# Patient Record
Sex: Female | Born: 1980 | Race: Black or African American | Hispanic: No | Marital: Single | Smoking: Never smoker
Health system: Southern US, Community
[De-identification: ages and names within clinical notes are randomized; demographics above are authoritative.]

## PROBLEM LIST (undated history)

## (undated) DIAGNOSIS — Z862 Personal history of diseases of the blood and blood-forming organs and certain disorders involving the immune mechanism: Secondary | ICD-10-CM

## (undated) DIAGNOSIS — F32A Depression, unspecified: Secondary | ICD-10-CM

## (undated) DIAGNOSIS — Z8742 Personal history of other diseases of the female genital tract: Secondary | ICD-10-CM

## (undated) DIAGNOSIS — Z87440 Personal history of urinary (tract) infections: Secondary | ICD-10-CM

## (undated) DIAGNOSIS — I10 Essential (primary) hypertension: Secondary | ICD-10-CM

## (undated) DIAGNOSIS — G43909 Migraine, unspecified, not intractable, without status migrainosus: Secondary | ICD-10-CM

## (undated) HISTORY — DX: Personal history of other diseases of the female genital tract: Z87.42

## (undated) HISTORY — DX: Personal history of diseases of the blood and blood-forming organs and certain disorders involving the immune mechanism: Z86.2

## (undated) HISTORY — DX: Migraine, unspecified, not intractable, without status migrainosus: G43.909

## (undated) HISTORY — DX: Depression, unspecified: F32.A

## (undated) HISTORY — DX: Personal history of urinary (tract) infections: Z87.440

---

## 2017-05-26 ENCOUNTER — Emergency Department: Payer: Self-pay

## 2017-05-26 ENCOUNTER — Emergency Department
Admission: EM | Admit: 2017-05-26 | Discharge: 2017-05-26 | Disposition: A | Discharge status: Home / Self Care | Payer: Self-pay | Attending: Emergency Medicine | Admitting: Emergency Medicine

## 2017-05-26 ENCOUNTER — Encounter: Payer: Self-pay | Admitting: Emergency Medicine

## 2017-05-26 DIAGNOSIS — I10 Essential (primary) hypertension: Secondary | ICD-10-CM | POA: Insufficient documentation

## 2017-05-26 DIAGNOSIS — R079 Chest pain, unspecified: Secondary | ICD-10-CM | POA: Insufficient documentation

## 2017-05-26 HISTORY — DX: Essential (primary) hypertension: I10

## 2017-05-26 LAB — BASIC METABOLIC PANEL
Anion gap: 5 (ref 5–15)
BUN: 15 mg/dL (ref 6–20)
CALCIUM: 9.3 mg/dL (ref 8.9–10.3)
CHLORIDE: 103 mmol/L (ref 101–111)
CO2: 28 mmol/L (ref 22–32)
CREATININE: 0.88 mg/dL (ref 0.44–1.00)
GFR calc non Af Amer: >60 mL/min (ref >60)
Glucose, Bld: 79 mg/dL (ref 65–99)
Potassium: 3.7 mmol/L (ref 3.5–5.1)
Sodium: 136 mmol/L (ref 135–145)

## 2017-05-26 LAB — CBC
HCT: 39 % (ref 35.0–47.0)
Hemoglobin: 13 g/dL (ref 12.0–16.0)
MCH: 31.9 pg (ref 26.0–34.0)
MCHC: 33.3 g/dL (ref 32.0–36.0)
MCV: 95.6 fL (ref 80.0–100.0)
PLATELETS: 302 10*3/uL (ref 150–440)
RBC: 4.08 MIL/uL (ref 3.80–5.20)
RDW: 13.2 % (ref 11.5–14.5)
WBC: 5.3 10*3/uL (ref 3.6–11.0)

## 2017-05-26 LAB — TROPONIN I

## 2017-05-26 MED ORDER — IBUPROFEN 800 MG PO TABS: 800.0000 mg | ORAL_TABLET | Freq: Three times a day (TID) | ORAL | 0 refills | Status: DC | PRN

## 2017-05-26 MED ORDER — KETOROLAC TROMETHAMINE 60 MG/2ML IM SOLN
60.0000 mg | Freq: Once | INTRAMUSCULAR | Status: AC
  Administered 2017-05-26: 60 mg via INTRAMUSCULAR
  Filled 2017-05-26: qty 2

## 2017-05-26 NOTE — ED Triage Notes (Signed)
Pt reports intermittent chest pain over the last 2 weeks; sharp pain to the center of her chest that is non-radiating; denies shortness of breath; reports "hot flashes" a week ago and vomiting once yesterday; pt says over the last few days pain has been more constant; also c/o headache; pt's blood pressure 159/99 in triage; pt has been out of her medication for HTN for about 1 month; denies dizziness/lightheadedness; alert and oriented x 3; talking in complete coherent sentences

## 2017-05-26 NOTE — ED Provider Notes (Signed)
[facility] Emergency Department Provider Note  ____________________________________________  Time seen: Approximately 8:41 PM  I have reviewed the triage vital signs and the nursing notes.   HISTORY  Chief Complaint Chest Pain    HPI Makayla Booker is a 36 y.o. female with a history of hypertension presenting with chest pain. The patient reports that for the past 2 weeks she has had a central sharp chest pain. Initially, it was intermittent, but now it is constant. It is worse if she presses on it, If she stands for long time, or if she lays down. If she walks around she does not feel it. She does not have any associated symptoms including radiation of pain, diaphoresis, nausea or vomiting, palpitations, lightheadedness or syncope. The pain is not worse with food. She occasionally has unrelated shortness of breath, which is chronic for her. She also has chronic migraines; when she takes Tylenol for her migraines, this does not help her chest pain. She is not tried anything else for her chest pain. Her last stress test was "years ago." She reports that she has been evaluated for atypical chest pain in the past and we'll that it was related to stress.  SH: Nonsmoker, denies cocaine  FH: No history of early CAD or blood clots Past Medical History:  Diagnosis Date  . Hypertension     There are no active problems to display for this patient.   History reviewed. No pertinent surgical history.    Allergies Penicillins  History reviewed. No pertinent family history.  Social History Social History  Substance Use Topics  . Smoking status: Never Smoker  . Smokeless tobacco: Never Used  . Alcohol use Yes    Review of Systems Constitutional: No fever/chills. Eyes: No visual changes. ENT: No sore throat. No congestion or rhinorrhea. Cardiovascular: + chest pain. Denies palpitations. Respiratory: Positive unchanged chronic shortness of breath.  No  cough. Gastrointestinal: No abdominal pain.  No nausea, no vomiting.  No diarrhea.  No constipation. Genitourinary: Negative for dysuria. Musculoskeletal: Negative for back pain. Skin: Negative for rash. Neurological: Positive unchanged chronic migraines. No focal numbness, tingling or weakness.     ____________________________________________   PHYSICAL EXAM:  VITAL SIGNS: ED Triage Vitals  Enc Vitals Group     BP 05/26/17 1945 (!) 159/99     Pulse Rate 05/26/17 1945 86     Resp 05/26/17 1945 18     Temp 05/26/17 1945 97.7 F (36.5 C)     Temp Source 05/26/17 1945 Oral     SpO2 05/26/17 1945 97 %     Weight 05/26/17 1945 252 lb (114.3 kg)     Height 05/26/17 1945 5\' 8"  (1.727 m)     Head Circumference --      Peak Flow --      Pain Score 05/26/17 1944 10     Pain Loc --      Pain Edu? --      Excl. in GC? --     Constitutional: Alert and oriented. Well appearing and in no acute distress. Answers questions appropriately. Eyes: Conjunctivae are normal.  EOMI. No scleral icterus. Head: Atraumatic. Nose: No congestion/rhinnorhea. Mouth/Throat: Mucous membranes are moist.  Neck: No stridor.  Supple.  No JVD. No meningismus Cardiovascular: Normal rate, regular rhythm. No murmurs, rubs or gallops. The patient's pain is reproducible when pressing on the center of her chest. She has no skin changes including ecchymosis, swelling or crepitus. Respiratory: Normal respiratory effort.  No  accessory muscle use or retractions. Lungs CTAB.  No wheezes, rales or ronchi. Gastrointestinal: Soft, nontender and nondistended.  No guarding or rebound.  No peritoneal signs. Musculoskeletal: No LE edema. No ttp in the calves or palpable cords.  Negative Homan's sign. Neurologic:  A&Ox3.  Speech is clear.  Face and smile are symmetric.  EOMI.  Moves all extremities well. Skin:  Skin is warm, dry and intact. No rash noted. Psychiatric: Mood and affect are normal. Speech and behavior are normal.   Normal judgement.  ____________________________________________   LABS (all labs ordered are listed, but only abnormal results are displayed)  Labs Reviewed  BASIC METABOLIC PANEL  CBC  TROPONIN I   ____________________________________________  EKG  ED ECG REPORT I, Rockne MenghiniNorman, Anne-Caroline, the attending physician, personally viewed and interpreted this ECG.   Date: 05/26/2017  EKG Time: 1936  Rate: 80  Rhythm: normal sinus rhythm  Axis: normal  Intervals:none  ST&T Change: No STEMI  ____________________________________________  RADIOLOGY  No results found.  ____________________________________________   PROCEDURES  Procedure(s) performed: None  Procedures  Critical Care performed: No ____________________________________________   INITIAL IMPRESSION / ASSESSMENT AND PLAN / ED COURSE  Pertinent labs & imaging results that were available during my care of the patient were reviewed by me and considered in my medical decision making (see chart for details).  36 y.o. female with a history of hypertension presenting with chest pain for the past 2 weeks that is reproducible on palpation. Overall, the patient is mildly hypertensive here but hemodynamically stable. She has a normal cardiopulmonary examination. Her EKG does not show ischemic changes and her troponin is negative. She has not been having any cough or cold symptoms that would be suggestive of an infectious cause for her symptoms. Her symptoms are not consistent with esophageal spasm or GERD, PUD. The patient has palpable pain, so musculoskeletal causes are possible but the patient has not had any straining or any significant coughing so I cannot explain why she would have this. PE or aortic pathology is very unlikely. At this time, the patient is stable for discharge. I have encouraged her to take Motrin for her pain, and follow up with her primary care physician. I have also asked her to make an appointment with  the cardiologist for risk stratification study since she has not had one for several years. Return precautions were discussed.  I have reviewed the patient's medical chart. ____________________________________________  FINAL CLINICAL IMPRESSION(S) / ED DIAGNOSES  Final diagnoses:  Chest pain, unspecified type         NEW MEDICATIONS STARTED DURING THIS VISIT:  New Prescriptions   IBUPROFEN (ADVIL,MOTRIN) 800 MG TABLET    Take 1 tablet (800 mg total) by mouth every 8 (eight) hours as needed.      Rockne MenghiniNorman, Anne-Caroline, MD 05/26/17 [phone]

## 2017-05-26 NOTE — ED Notes (Signed)
Blood drawn by ED tech Lorin PicketScott

## 2017-05-26 NOTE — ED Notes (Signed)
Pt back to triage from xray; will draw blood and take pt to treatment room in CPOD; verbal report already given to Dr Sharma CovertNorman

## 2017-05-26 NOTE — Discharge Instructions (Signed)
Please take Motrin, with food, three times daily for the next three days, then as needed.  You may additionally take Tylenol for your pain.  Please return to the emergency department if you develop severe pain, shortness of breath, palpitations, fever, or any other symptoms concerning to you

## 2018-03-13 ENCOUNTER — Encounter: Payer: Self-pay | Admitting: Emergency Medicine

## 2018-03-13 ENCOUNTER — Emergency Department
Admission: EM | Admit: 2018-03-13 | Discharge: 2018-03-13 | Disposition: A | Discharge status: Home / Self Care | Payer: Medicaid Other | Attending: Emergency Medicine | Admitting: Emergency Medicine

## 2018-03-13 ENCOUNTER — Emergency Department: Payer: Medicaid Other

## 2018-03-13 DIAGNOSIS — I10 Essential (primary) hypertension: Secondary | ICD-10-CM | POA: Insufficient documentation

## 2018-03-13 DIAGNOSIS — F419 Anxiety disorder, unspecified: Secondary | ICD-10-CM

## 2018-03-13 DIAGNOSIS — R079 Chest pain, unspecified: Secondary | ICD-10-CM

## 2018-03-13 LAB — CBC
HCT: 41.6 % (ref 35.0–47.0)
Hemoglobin: 14 g/dL (ref 12.0–16.0)
MCH: 32.6 pg (ref 26.0–34.0)
MCHC: 33.7 g/dL (ref 32.0–36.0)
MCV: 96.7 fL (ref 80.0–100.0)
PLATELETS: 335 10*3/uL (ref 150–440)
RBC: 4.3 MIL/uL (ref 3.80–5.20)
RDW: 13.2 % (ref 11.5–14.5)
WBC: 7.6 10*3/uL (ref 3.6–11.0)

## 2018-03-13 LAB — TROPONIN I: Troponin I: <0.03 ng/mL (ref <0.03)

## 2018-03-13 LAB — BASIC METABOLIC PANEL
Anion gap: 8 (ref 5–15)
BUN: 15 mg/dL (ref 6–20)
CHLORIDE: 104 mmol/L (ref 98–111)
CO2: 25 mmol/L (ref 22–32)
CREATININE: 1.05 mg/dL — AB (ref 0.44–1.00)
Calcium: 9.7 mg/dL (ref 8.9–10.3)
GFR calc Af Amer: >60 mL/min (ref >60)
GFR calc non Af Amer: >60 mL/min (ref >60)
GLUCOSE: 97 mg/dL (ref 70–99)
Potassium: 4 mmol/L (ref 3.5–5.1)
Sodium: 137 mmol/L (ref 135–145)

## 2018-03-13 MED ORDER — LORAZEPAM 1 MG PO TABS
1.0000 mg | ORAL_TABLET | Freq: Once | ORAL | Status: AC
  Administered 2018-03-13: 1 mg via ORAL

## 2018-03-13 MED ORDER — LORAZEPAM 1 MG PO TABS
ORAL_TABLET | ORAL | Status: AC
  Administered 2018-03-13: 1 mg via ORAL
  Filled 2018-03-13: qty 1

## 2018-03-13 MED ORDER — LORAZEPAM 1 MG PO TABS: 1.0000 mg | ORAL_TABLET | Freq: Two times a day (BID) | ORAL | 0 refills | Status: AC | PRN

## 2018-03-13 NOTE — ED Triage Notes (Signed)
Pt c/o intermittent central chest pain with occasional SOB x1 week. Pt denies N/V.

## 2018-03-13 NOTE — ED Notes (Signed)
Pt verbalizes d/c undedrstanding and RX . Pt calling [employer] for ride home . PT ambulatory, A&OX4

## 2018-03-13 NOTE — ED Provider Notes (Signed)
[facility] Emergency Department Provider Note       Time seen: ----------------------------------------- 9:18 PM on 03/13/2018 -----------------------------------------   I have reviewed the triage vital signs and the nursing notes.  HISTORY   Chief Complaint Chest Pain    HPI Makayla Booker is a [age] y.o. female with a history of hypertension who presents to the ED for chest pain the center part of her chest with occasional shortness of breath for the last week.  Patient denies any nausea or vomiting or other associated symptoms.  She denies any recent illness or other complaints.  Patient states she is under significant stress and currently has an unstable living environment.  Past Medical History:  Diagnosis Date  . Hypertension     There are no active problems to display for this patient.   History reviewed. No pertinent surgical history.  Allergies Penicillins  Social History Social History   Tobacco Use  . Smoking status: Never Smoker  . Smokeless tobacco: Never Used  Substance Use Topics  . Alcohol use: Yes  . Drug use: No   Review of Systems Constitutional: Negative for fever. Cardiovascular: Positive for chest pain Respiratory: Positive for shortness of breath Gastrointestinal: Negative for abdominal pain, vomiting and diarrhea. Genitourinary: Negative for dysuria. Musculoskeletal: Negative for back pain. Skin: Negative for rash. Neurological: Negative for headaches, focal weakness or numbness. Psychiatric: Positive for anxiety  All systems negative/normal/unremarkable except as stated in the HPI  ____________________________________________   PHYSICAL EXAM:  VITAL SIGNS: ED Triage Vitals [03/13/18 2034]  Enc Vitals Group     BP 137/81     Pulse Rate 98     Resp 17     Temp 98 F (36.7 C)     Temp Source Oral     SpO2 98 %     Weight 273 lb (123.8 kg)     Height 5\' 8"  (1.727 m)     Head Circumference      Peak  Flow      Pain Score      Pain Loc      Pain Edu?      Excl. in GC?    Constitutional: Alert and oriented. Well appearing and in no distress. Eyes: Conjunctivae are normal. Normal extraocular movements. ENT   Head: Normocephalic and atraumatic.   Nose: No congestion/rhinnorhea.   Mouth/Throat: Mucous membranes are moist.   Neck: No stridor. Cardiovascular: Normal rate, regular rhythm. No murmurs, rubs, or gallops. Respiratory: Normal respiratory effort without tachypnea nor retractions. Breath sounds are clear and equal bilaterally. No wheezes/rales/rhonchi. Gastrointestinal: Soft and nontender. Normal bowel sounds Musculoskeletal: Nontender with normal range of motion in extremities. No lower extremity tenderness nor edema. Neurologic:  Normal speech and language. No gross focal neurologic deficits are appreciated.  Skin:  Skin is warm, dry and intact. No rash noted. Psychiatric: Mood and affect are normal. Speech and behavior are normal.  ____________________________________________  EKG: Interpreted by me.  Sinus tachycardia with a rate of 105 bpm, normal PR interval, normal QRS, normal QT  ____________________________________________  ED COURSE:  As part of my medical decision making, I reviewed the following data within the electronic MEDICAL RECORD NUMBER History obtained from family if available, nursing notes, old chart and ekg, as well as notes from prior ED visits. Patient presented for chest pain shortness of breath, we will assess with labs and imaging as indicated at this time.   Procedures ____________________________________________   LABS (pertinent positives/negatives)  Labs Reviewed  BASIC METABOLIC PANEL - Abnormal; Notable for the following components:      Result Value   Creatinine, Ser 1.05 (*)    All other components within normal limits  CBC  TROPONIN I  POC URINE PREG, ED    RADIOLOGY  Chest x-ray is  normal  ____________________________________________  DIFFERENTIAL DIAGNOSIS   Musculoskeletal pain, GERD, anxiety, unstable angina unlikely  FINAL ASSESSMENT AND PLAN  Chest pain   Plan: The patient had presented for chest pain which seems noncardiac. Patient's labs were reassuring. Patient's imaging was also reassuring.  Her symptoms seem to be stress related, we gave her a dose of Ativan here and will prescribe similar for her to take as needed.  She is cleared for outpatient follow-up.   Ulice Dash, MD   Note: This note was generated in part or whole with voice recognition software. Voice recognition is usually quite accurate but there are transcription errors that can and very often do occur. I apologize for any typographical errors that were not detected and corrected.     Emily Filbert, MD 03/13/18 2140

## 2018-08-20 ENCOUNTER — Ambulatory Visit: Payer: Self-pay

## 2018-08-27 ENCOUNTER — Encounter: Payer: Self-pay | Admitting: Gerontology

## 2018-08-27 ENCOUNTER — Ambulatory Visit: Payer: Medicaid Other | Admitting: Gerontology

## 2018-08-27 ENCOUNTER — Other Ambulatory Visit: Payer: Self-pay

## 2018-08-27 VITALS — BP 147/99 | HR 90 | Ht 68.0 in | Wt 281.8 lb

## 2018-08-27 DIAGNOSIS — Z0000 Encounter for general adult medical examination without abnormal findings: Secondary | ICD-10-CM

## 2018-08-27 DIAGNOSIS — Z7689 Persons encountering health services in other specified circumstances: Secondary | ICD-10-CM

## 2018-08-27 DIAGNOSIS — I10 Essential (primary) hypertension: Secondary | ICD-10-CM

## 2018-08-27 MED ORDER — AMLODIPINE BESYLATE 10 MG PO TABS: 10.0000 mg | ORAL_TABLET | Freq: Every day | ORAL | 0 refills | Status: DC

## 2018-08-27 NOTE — Patient Instructions (Signed)
Amlodipine tablets What is this medicine? AMLODIPINE (am LOE di peen) is a calcium-channel blocker. It affects the amount of calcium found in your heart and muscle cells. This relaxes your blood vessels, which can reduce the amount of work the heart has to do. This medicine is used to lower high blood pressure. It is also used to prevent chest pain. This medicine may be used for other purposes; ask your health care provider or pharmacist if you have questions. COMMON BRAND NAME(S): Norvasc What should I tell my health care provider before I take this medicine? They need to know if you have any of these conditions: -heart disease -liver disease -an unusual or allergic reaction to amlodipine, other medicines, foods, dyes, or preservatives -pregnant or trying to get pregnant -breast-feeding How should I use this medicine? Take this medicine by mouth with a glass of water. Follow the directions on the prescription label. You can take it with or without food. If it upsets your stomach, take it with food. Take your medicine at regular intervals. Do not take it more often than directed. Do not stop taking except on your doctor's advice. Talk to your pediatrician regarding the use of this medicine in children. While this drug may be prescribed for children as young as 6 years for selected conditions, precautions do apply. Patients over 65 years of age may have a stronger reaction and need a smaller dose. Overdosage: If you think you have taken too much of this medicine contact a poison control center or emergency room at once. NOTE: This medicine is only for you. Do not share this medicine with others. What if I miss a dose? If you miss a dose, take it as soon as you can. If it is almost time for your next dose, take only that dose. Do not take double or extra doses. What may interact with this medicine? Do not take this medicine with any of the following medications: -tranylcypromine This medicine  may also interact with the following medications: -clarithromycin -cyclosporine -diltiazem -itraconazole -simvastatin -tacrolimus This list may not describe all possible interactions. Give your health care provider a list of all the medicines, herbs, non-prescription drugs, or dietary supplements you use. Also tell them if you smoke, drink alcohol, or use illegal drugs. Some items may interact with your medicine. What should I watch for while using this medicine? Visit your healthcare professional for regular checks on your progress. Check your blood pressure as directed. Ask your healthcare professional what your blood pressure should be and when you should contact him or her. Do not treat yourself for coughs, colds, or pain while you are using this medicine without asking your healthcare professional for advice. Some medicines may increase your blood pressure. You may get dizzy. Do not drive, use machinery, or do anything that needs mental alertness until you know how this medicine affects you. Do not stand or sit up quickly, especially if you are an older patient. This reduces the risk of dizzy or fainting spells. Avoid alcoholic drinks; they can make you dizzier. What side effects may I notice from receiving this medicine? Side effects that you should report to your doctor or health care professional as soon as possible: -allergic reactions like skin rash, itching or hives; swelling of the face, lips, or tongue -fast, irregular heartbeat -signs and symptoms of low blood pressure like dizziness; feeling faint or lightheaded, falls; unusually weak or tired -swelling of ankles, feet, hands Side effects that usually do not require medical   attention (report these to your doctor or health care professional if they continue or are bothersome): -dry mouth -facial flushing -headache -stomach pain -tiredness This list may not describe all possible side effects. Call your doctor for medical advice  about side effects. You may report side effects to FDA at 1-800-FDA-1088. Where should I keep my medicine? Keep out of the reach of children. Store at room temperature between 59 and 86 degrees F (15 and 30 degrees C). Throw away any unused medicine after the expiration date. NOTE: This sheet is a summary. It may not cover all possible information. If you have questions about this medicine, talk to your doctor, pharmacist, or health care provider.  2019 Elsevier/Gold Standard (2018-02-14 15:07:10) DASH Eating Plan DASH stands for "Dietary Approaches to Stop Hypertension." The DASH eating plan is a healthy eating plan that has been shown to reduce high blood pressure (hypertension). It may also reduce your risk for type 2 diabetes, heart disease, and stroke. The DASH eating plan may also help with weight loss. What are tips for following this plan?  General guidelines  Avoid eating more than 2,300 mg (milligrams) of salt (sodium) a day. If you have hypertension, you may need to reduce your sodium intake to 1,500 mg a day.  Limit alcohol intake to no more than 1 drink a day for nonpregnant women and 2 drinks a day for men. One drink equals 12 oz of beer, 5 oz of wine, or 1 oz of hard liquor.  Work with your health care provider to maintain a healthy body weight or to lose weight. Ask what an ideal weight is for you.  Get at least 30 minutes of exercise that causes your heart to beat faster (aerobic exercise) most days of the week. Activities may include walking, swimming, or biking.  Work with your health care provider or diet and nutrition specialist (dietitian) to adjust your eating plan to your individual calorie needs. Reading food labels   Check food labels for the amount of sodium per serving. Choose foods with less than 5 percent of the Daily Value of sodium. Generally, foods with less than 300 mg of sodium per serving fit into this eating plan.  To find whole grains, look for the  word "whole" as the first word in the ingredient list. Shopping  Buy products labeled as "low-sodium" or "no salt added."  Buy fresh foods. Avoid canned foods and premade or frozen meals. Cooking  Avoid adding salt when cooking. Use salt-free seasonings or herbs instead of table salt or sea salt. Check with your health care provider or pharmacist before using salt substitutes.  Do not fry foods. Cook foods using healthy methods such as baking, boiling, grilling, and broiling instead.  Cook with heart-healthy oils, such as olive, canola, soybean, or sunflower oil. Meal planning  Eat a balanced diet that includes: ? 5 or more servings of fruits and vegetables each day. At each meal, try to fill half of your plate with fruits and vegetables. ? Up to 6-8 servings of whole grains each day. ? Less than 6 oz of lean meat, poultry, or fish each day. A 3-oz serving of meat is about the same size as a deck of cards. One egg equals 1 oz. ? 2 servings of low-fat dairy each day. ? A serving of nuts, seeds, or beans 5 times each week. ? Heart-healthy fats. Healthy fats called Omega-3 fatty acids are found in foods such as flaxseeds and coldwater fish, like sardines,   salmon, and mackerel.  Limit how much you eat of the following: ? Canned or prepackaged foods. ? Food that is high in trans fat, such as fried foods. ? Food that is high in saturated fat, such as fatty meat. ? Sweets, desserts, sugary drinks, and other foods with added sugar. ? Full-fat dairy products.  Do not salt foods before eating.  Try to eat at least 2 vegetarian meals each week.  Eat more home-cooked food and less restaurant, buffet, and fast food.  When eating at a restaurant, ask that your food be prepared with less salt or no salt, if possible. What foods are recommended? The items listed may not be a complete list. Talk with your dietitian about what dietary choices are best for you. Grains Whole-grain or whole-wheat  bread. Whole-grain or whole-wheat pasta. Brown rice. Oatmeal. Quinoa. Bulgur. Whole-grain and low-sodium cereals. Pita bread. Low-fat, low-sodium crackers. Whole-wheat flour tortillas. Vegetables Fresh or frozen vegetables (raw, steamed, roasted, or grilled). Low-sodium or reduced-sodium tomato and vegetable juice. Low-sodium or reduced-sodium tomato sauce and tomato paste. Low-sodium or reduced-sodium canned vegetables. Fruits All fresh, dried, or frozen fruit. Canned fruit in natural juice (without added sugar). Meat and other protein foods Skinless chicken or turkey. Ground chicken or turkey. Pork with fat trimmed off. Fish and seafood. Egg whites. Dried beans, peas, or lentils. Unsalted nuts, nut butters, and seeds. Unsalted canned beans. Lean cuts of beef with fat trimmed off. Low-sodium, lean deli meat. Dairy Low-fat (1%) or fat-free (skim) milk. Fat-free, low-fat, or reduced-fat cheeses. Nonfat, low-sodium ricotta or cottage cheese. Low-fat or nonfat yogurt. Low-fat, low-sodium cheese. Fats and oils Soft margarine without trans fats. Vegetable oil. Low-fat, reduced-fat, or light mayonnaise and salad dressings (reduced-sodium). Canola, safflower, olive, soybean, and sunflower oils. Avocado. Seasoning and other foods Herbs. Spices. Seasoning mixes without salt. Unsalted popcorn and pretzels. Fat-free sweets. What foods are not recommended? The items listed may not be a complete list. Talk with your dietitian about what dietary choices are best for you. Grains Baked goods made with fat, such as croissants, muffins, or some breads. Dry pasta or rice meal packs. Vegetables Creamed or fried vegetables. Vegetables in a cheese sauce. Regular canned vegetables (not low-sodium or reduced-sodium). Regular canned tomato sauce and paste (not low-sodium or reduced-sodium). Regular tomato and vegetable juice (not low-sodium or reduced-sodium). Pickles. Olives. Fruits Canned fruit in a light or heavy  syrup. Fried fruit. Fruit in cream or butter sauce. Meat and other protein foods Fatty cuts of meat. Ribs. Fried meat. Bacon. Sausage. Bologna and other processed lunch meats. Salami. Fatback. Hotdogs. Bratwurst. Salted nuts and seeds. Canned beans with added salt. Canned or smoked fish. Whole eggs or egg yolks. Chicken or turkey with skin. Dairy Whole or 2% milk, cream, and half-and-half. Whole or full-fat cream cheese. Whole-fat or sweetened yogurt. Full-fat cheese. Nondairy creamers. Whipped toppings. Processed cheese and cheese spreads. Fats and oils Butter. Stick margarine. Lard. Shortening. Ghee. Bacon fat. Tropical oils, such as coconut, palm kernel, or palm oil. Seasoning and other foods Salted popcorn and pretzels. Onion salt, garlic salt, seasoned salt, table salt, and sea salt. Worcestershire sauce. Tartar sauce. Barbecue sauce. Teriyaki sauce. Soy sauce, including reduced-sodium. Steak sauce. Canned and packaged gravies. Fish sauce. Oyster sauce. Cocktail sauce. Horseradish that you find on the shelf. Ketchup. Mustard. Meat flavorings and tenderizers. Bouillon cubes. Hot sauce and Tabasco sauce. Premade or packaged marinades. Premade or packaged taco seasonings. Relishes. Regular salad dressings. Where to find more information:  National   Heart, Lung, and Blood Institute: www.nhlbi.nih.gov  American Heart Association: www.heart.org Summary  The DASH eating plan is a healthy eating plan that has been shown to reduce high blood pressure (hypertension). It may also reduce your risk for type 2 diabetes, heart disease, and stroke.  With the DASH eating plan, you should limit salt (sodium) intake to 2,300 mg a day. If you have hypertension, you may need to reduce your sodium intake to 1,500 mg a day.  When on the DASH eating plan, aim to eat more fresh fruits and vegetables, whole grains, lean proteins, low-fat dairy, and heart-healthy fats.  Work with your health care provider or diet and  nutrition specialist (dietitian) to adjust your eating plan to your individual calorie needs. This information is not intended to replace advice given to you by your health care provider. Make sure you discuss any questions you have with your health care provider. Document Released: 07/12/2011 Document Revised: 07/16/2016 Document Reviewed: 07/16/2016 Elsevier Interactive Patient Education  2019 Elsevier Inc.  

## 2018-08-27 NOTE — Progress Notes (Signed)
Patient: Makayla Booker Female    DOB: 10/27/1980   38 y.o.   MRN: [id number] Visit Date: 08/27/2018  Today's Provider: Langston Reusing, NP   Chief Complaint  Patient presents with  . New Patient (Initial Visit)    establish care; high blood pressure   Subjective:    HPI Ms. Makayla Booker 38 y/o female who presents for initial visit and discuss about her history of hypertension. She stated that she was out of Amlodipine 10 mg for 6 months because she was homeless and doesn't have a pcp. She reports having  non radiating intermittent  4/10 left frontal headache that started today, and she admits  taking 650 mg of tylenol with relief. She reported having a history of migraine and has not had any severe migraine episode for 6 months. She denies chest pain, palpitation, dizziness, shortness of breath, fever nor chills. Otherwise she reports being in good health.    Allergies  Allergen Reactions  . Penicillins Other (See Comments)    Unknown allergy, was told as a child by her mother to not take   Previous Medications   AMLODIPINE (NORVASC) 10 MG TABLET    TAKE ONE TABLET BY MOUTH ONCE DAILY   IBUPROFEN (ADVIL,MOTRIN) 800 MG TABLET    Take 1 tablet (800 mg total) by mouth every 8 (eight) hours as needed.    Review of Systems  Constitutional: Negative.   HENT: Negative.   Eyes: Negative.   Respiratory: Negative.   Cardiovascular: Negative.   Gastrointestinal: Negative.   Endocrine: Negative.   Genitourinary: Negative.   Musculoskeletal: Negative.   Skin: Negative.   Neurological: Negative.   Psychiatric/Behavioral: Negative.     Social History   Tobacco Use  . Smoking status: Never Smoker  . Smokeless tobacco: Never Used  Substance Use Topics  . Alcohol use: Yes   Objective:   BP (!) 147/99 (BP Location: Left Arm, Patient Position: Sitting)   Pulse 90   Ht '5\' 8"'$  (1.727 m)   Wt 281 lb 12.8 oz (127.8 kg)   SpO2 99%   BMI 42.85 kg/m   Physical Exam Constitutional:    Appearance: Normal appearance. She is obese.  HENT:     Head: Normocephalic and atraumatic.     Mouth/Throat:     Mouth: Mucous membranes are moist.  Eyes:     Extraocular Movements: Extraocular movements intact.     Pupils: Pupils are equal, round, and reactive to light.  Neck:     Musculoskeletal: Normal range of motion.  Cardiovascular:     Rate and Rhythm: Normal rate and regular rhythm.     Pulses: Normal pulses.     Heart sounds: Normal heart sounds.  Pulmonary:     Effort: Pulmonary effort is normal.     Breath sounds: Normal breath sounds.  Abdominal:     General: Bowel sounds are normal.     Palpations: Abdomen is soft.  Musculoskeletal: Normal range of motion.  Skin:    General: Skin is warm and dry.  Neurological:     General: No focal deficit present.     Mental Status: She is alert and oriented to person, place, and time.  Psychiatric:        Mood and Affect: Mood normal.        Behavior: Behavior normal.        Thought Content: Thought content normal.        Judgment: Judgment normal.  Assessment & Plan:     1. Encounter to establish care - Follow up in 2 weeks - CBC w/Diff; Future - Comp Met (CMET); Future  2. Essential hypertension - Uncontrolled hypertension secondary to not taking medication , goal BP < 140/90 and blood pressure at visit was 147/99. - She was advised on lifestyle modifications, low salt diet, 30 minutes daily exercise and weight loss. - She will restart 10 mg Amlodipine, was educated on medication side effects. - amLODipine (NORVASC) 10 MG tablet; Take 1 tablet (10 mg total) by mouth daily.  Dispense: 30 tablet; Refill: 0  3. Health care maintenance - She declined Influenza vaccine - She stated that her Pap smear is due 01/2019 - She was provided with HIV screening information. - Labs collected during office visit - CBC w/Diff; Future - Comp Met (CMET); Future - HgB A1c; Future - Lipid Profile; Future - TSH; Future -  Urinalysis; Future - Urine Microalbumin w/creat. ratio; Future - Follow up in 2 weeks       [place], NP   [facility]

## 2018-08-28 LAB — URINALYSIS
Bilirubin, UA: NEGATIVE
Glucose, UA: NEGATIVE
Ketones, UA: NEGATIVE
LEUKOCYTES UA: NEGATIVE
Nitrite, UA: NEGATIVE
PROTEIN UA: NEGATIVE
Specific Gravity, UA: 1.025 (ref 1.005–1.030)
Urobilinogen, Ur: 0.2 mg/dL (ref 0.2–1.0)
pH, UA: 5.5 (ref 5.0–7.5)

## 2018-08-28 LAB — CBC WITH DIFFERENTIAL/PLATELET
BASOS: 1 %
Basophils Absolute: 0.1 10*3/uL (ref 0.0–0.2)
EOS (ABSOLUTE): 0.2 10*3/uL (ref 0.0–0.4)
EOS: 3 %
HEMATOCRIT: 38.8 % (ref 34.0–46.6)
Hemoglobin: 12.9 g/dL (ref 11.1–15.9)
Immature Grans (Abs): 0 10*3/uL (ref 0.0–0.1)
Immature Granulocytes: 0 %
Lymphocytes Absolute: 1.5 10*3/uL (ref 0.7–3.1)
Lymphs: 20 %
MCH: 31.9 pg (ref 26.6–33.0)
MCHC: 33.2 g/dL (ref 31.5–35.7)
MCV: 96 fL (ref 79–97)
Monocytes Absolute: 0.6 10*3/uL (ref 0.1–0.9)
Monocytes: 8 %
Neutrophils Absolute: 5.1 10*3/uL (ref 1.4–7.0)
Neutrophils: 68 %
Platelets: 329 10*3/uL (ref 150–450)
RBC: 4.04 x10E6/uL (ref 3.77–5.28)
RDW: 11.7 % (ref 11.7–15.4)
WBC: 7.5 10*3/uL (ref 3.4–10.8)

## 2018-08-28 LAB — LIPID PANEL
CHOL/HDL RATIO: 5.2 ratio — AB (ref 0.0–4.4)
Cholesterol, Total: 187 mg/dL (ref 100–199)
HDL: 36 mg/dL — ABNORMAL LOW (ref >39)
LDL Calculated: 114 mg/dL — ABNORMAL HIGH (ref 0–99)
Triglycerides: 187 mg/dL — ABNORMAL HIGH (ref 0–149)
VLDL CHOLESTEROL CAL: 37 mg/dL (ref 5–40)

## 2018-08-28 LAB — TSH: TSH: 1.85 u[IU]/mL (ref 0.450–4.500)

## 2018-08-28 LAB — HEMOGLOBIN A1C
Est. average glucose Bld gHb Est-mCnc: 108 mg/dL
Hgb A1c MFr Bld: 5.4 % (ref 4.8–5.6)

## 2018-08-28 LAB — COMPREHENSIVE METABOLIC PANEL
ALT: 28 IU/L (ref 0–32)
AST: 18 IU/L (ref 0–40)
Albumin/Globulin Ratio: 1.2 (ref 1.2–2.2)
Albumin: 4.4 g/dL (ref 3.8–4.8)
Alkaline Phosphatase: 65 IU/L (ref 39–117)
BUN/Creatinine Ratio: 16 (ref 9–23)
BUN: 12 mg/dL (ref 6–20)
Bilirubin Total: 0.5 mg/dL (ref 0.0–1.2)
CO2: 23 mmol/L (ref 20–29)
Calcium: 9.8 mg/dL (ref 8.7–10.2)
Chloride: 100 mmol/L (ref 96–106)
Creatinine, Ser: 0.73 mg/dL (ref 0.57–1.00)
GFR calc Af Amer: 122 mL/min/{1.73_m2} (ref >59)
GFR calc non Af Amer: 106 mL/min/{1.73_m2} (ref >59)
GLOBULIN, TOTAL: 3.6 g/dL (ref 1.5–4.5)
Glucose: 95 mg/dL (ref 65–99)
Potassium: 4.8 mmol/L (ref 3.5–5.2)
Sodium: 139 mmol/L (ref 134–144)
Total Protein: 8 g/dL (ref 6.0–8.5)

## 2018-08-28 LAB — MICROALBUMIN / CREATININE URINE RATIO
Creatinine, Urine: 169.3 mg/dL
Microalb/Creat Ratio: 9 mg/g creat (ref 0–29)
Microalbumin, Urine: 16 ug/mL

## 2018-09-03 ENCOUNTER — Ambulatory Visit: Payer: Medicaid Other

## 2018-09-10 ENCOUNTER — Ambulatory Visit: Payer: Medicaid Other | Admitting: Pharmacy Technician

## 2018-09-11 ENCOUNTER — Ambulatory Visit: Payer: Medicaid Other | Admitting: Gerontology

## 2018-09-11 NOTE — Progress Notes (Signed)
Patient scheduled for eligibility appointment at [facility].  Patient did not show for the appointment on 09/10/18 at 10:30a.m.  Patient did not reschedule eligibility appointment.  [facility] unable to provide additional medication assistance until eligibility is determined.  Will contact patient to try to do phone consult.  Sherilyn Dacosta Care Manager [facility]

## 2018-09-12 ENCOUNTER — Other Ambulatory Visit: Payer: Self-pay

## 2018-09-12 ENCOUNTER — Emergency Department
Admission: EM | Admit: 2018-09-12 | Discharge: 2018-09-12 | Disposition: A | Discharge status: Home / Self Care | Payer: Medicaid Other | Attending: Emergency Medicine | Admitting: Emergency Medicine

## 2018-09-12 DIAGNOSIS — I10 Essential (primary) hypertension: Secondary | ICD-10-CM | POA: Insufficient documentation

## 2018-09-12 DIAGNOSIS — M545 Low back pain, unspecified: Secondary | ICD-10-CM

## 2018-09-12 DIAGNOSIS — Z79899 Other long term (current) drug therapy: Secondary | ICD-10-CM | POA: Insufficient documentation

## 2018-09-12 MED ORDER — MELOXICAM 15 MG PO TABS: 15.0000 mg | ORAL_TABLET | Freq: Every day | ORAL | 0 refills | Status: DC

## 2018-09-12 MED ORDER — CYCLOBENZAPRINE HCL 10 MG PO TABS
10.0000 mg | ORAL_TABLET | Freq: Once | ORAL | Status: AC
  Administered 2018-09-12: 10 mg via ORAL
  Filled 2018-09-12: qty 1

## 2018-09-12 MED ORDER — MELOXICAM 7.5 MG PO TABS
15.0000 mg | ORAL_TABLET | Freq: Once | ORAL | Status: AC
  Administered 2018-09-12: 15 mg via ORAL
  Filled 2018-09-12: qty 2

## 2018-09-12 MED ORDER — CYCLOBENZAPRINE HCL 10 MG PO TABS: 10.0000 mg | ORAL_TABLET | Freq: Three times a day (TID) | ORAL | 0 refills | Status: DC | PRN

## 2018-09-12 NOTE — ED Provider Notes (Signed)
[facility] Emergency Department Provider Note ____________________________________________  Time seen: Approximately 9:00 PM  I have reviewed the triage vital signs and the nursing notes.  HISTORY  Chief Complaint Back Pain   HPI Makayla Booker is a 38 y.o. female who presents to the emergency department for treatment and evaluation of back pain.  No specific injury.  She denies dysuria.  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  She has taken ibuprofen and Tylenol without significant relief.  She is also used heat patch which did provide some relief.  Past Medical History:  Diagnosis Date  . Hypertension   . Migraines     There are no active problems to display for this patient.   History reviewed. No pertinent surgical history.  Prior to Admission medications   Medication Sig Start Date End Date Taking? Authorizing Provider  amLODipine (NORVASC) 10 MG tablet Take 1 tablet (10 mg total) by mouth daily. 08/27/18   Iloabachie, Chioma E, NP  cyclobenzaprine (FLEXERIL) 10 MG tablet Take 1 tablet (10 mg total) by mouth 3 (three) times daily as needed for muscle spasms. 09/12/18   Tanaka Gillen B, FNP  ibuprofen (ADVIL,MOTRIN) 800 MG tablet Take 1 tablet (800 mg total) by mouth every 8 (eight) hours as needed. 05/26/17   Rockne MenghiniNorman, Anne-Caroline, MD  meloxicam (MOBIC) 15 MG tablet Take 1 tablet (15 mg total) by mouth daily. 09/12/18   Chinita Pesterriplett, Avigayil Ton B, FNP    Allergies Penicillins  Family History  Problem Relation Age of Onset  . Cancer Maternal Grandmother     Social History Social History   Tobacco Use  . Smoking status: Never Smoker  . Smokeless tobacco: Never Used  Substance Use Topics  . Alcohol use: Yes  . Drug use: No    Review of Systems Constitutional: Well appearing. Respiratory: Negative for dyspnea. Cardiovascular: Negative for change in skin temperature or  color. Musculoskeletal:   Negative for chronic steroid use   Negative for trauma in the presence of osteoporosis  Negative for age over 4750 and trauma.  Negative for constitutional symptoms, or history of cancer  Negative for pain worse at night. Skin: Negative for rash, lesion, or wound.  Genitourinary: Negative for urinary retention. Rectal: Negative for fecal incontinence or new onset constipation/bowel habit changes. Hematological/Immunilogical: Negative for immunosuppression, IV drug use, or fever Neurological: Negative for burning, tingling, numb, electric, radiating pain in the lower extremities.                        Negative for saddle anesthesia.                        Negative for focal neurologic deficit, progressive or disabling symptoms             Negative for saddle anesthesia. ____________________________________________   PHYSICAL EXAM:  VITAL SIGNS: ED Triage Vitals  Enc Vitals Group     BP 09/12/18 1914 118/79     Pulse Rate 09/12/18 1914 (!) 101     Resp 09/12/18 1914 17     Temp 09/12/18 1914 98.4 F (36.9 C)     Temp Source 09/12/18 1914 Oral     SpO2 09/12/18 1914 100 %     Weight 09/12/18 1913 280 lb (127 kg)     Height 09/12/18 1913 5\' 8"  (1.727 m)  Head Circumference --      Peak Flow --      Pain Score 09/12/18 1913 10     Pain Loc --      Pain Edu? --      Excl. in GC? --     Constitutional: Alert and oriented. Well appearing and in no acute distress. Eyes: Conjunctivae are clear without discharge or drainage.  Head: Atraumatic. Neck: Full, active range of motion. Respiratory: Respirations even and unlabored. Musculoskeletal: Full ROM of the back and extremities, Strength 5/5 of the lower extremities as tested. Neurologic: Reflexes of the lower extremities are 2+. negative straight leg raise on the right and left side. Skin: Atraumatic.  Psychiatric: Behavior and affect are normal.  ____________________________________________    LABS (all labs ordered are listed, but only abnormal results are displayed)  Labs Reviewed - No data to display ____________________________________________  RADIOLOGY  Not indicated ____________________________________________   PROCEDURES  Procedure(s) performed:  Procedures ____________________________________________   INITIAL IMPRESSION / ASSESSMENT AND PLAN / ED COURSE  Makayla ChenShurita N Euceda is a 38 y.o. female who presents to the emergency department for treatment and evaluation of nontraumatic back pain.  She will be treated with Flexeril and meloxicam.  She was given a work excuse for the next few days as well.  She was advised to call and schedule an appointment with primary care provider of her choice for symptoms that are not improving with medications and rest.  She was encouraged to return to the emergency department for symptoms of change or worsen if she is unable to schedule an appointment.  Medications  cyclobenzaprine (FLEXERIL) tablet 10 mg (10 mg Oral Given 09/12/18 2123)  meloxicam (MOBIC) tablet 15 mg (15 mg Oral Given 09/12/18 2124)    ED Discharge Orders         Ordered    cyclobenzaprine (FLEXERIL) 10 MG tablet  3 times daily PRN     09/12/18 2119    meloxicam (MOBIC) 15 MG tablet  Daily     09/12/18 2119           Pertinent labs & imaging results that were available during my care of the patient were reviewed by me and considered in my medical decision making (see chart for details).  _________________________________________   FINAL CLINICAL IMPRESSION(S) / ED DIAGNOSES  Final diagnoses:  Acute bilateral low back pain without sciatica     If controlled substance prescribed during this visit, 12 month history viewed on the [facility] prior to issuing an initial prescription for Schedule II or III opiod.    Chinita Pesterriplett, Arjen Deringer B, FNP 09/12/18 2359    Rockne MenghiniNorman, Anne-Caroline, MD 09/16/18 1248

## 2018-09-12 NOTE — ED Triage Notes (Signed)
Pt arrives to ED via POV from home with c/o bilateral lower back pain without radiation into the lower extremities x2 weeks. No recent fall, injury, or trauma. No urinary s/x's or c/o's.

## 2018-09-12 NOTE — Discharge Instructions (Signed)
Please follow up with primary care if not improving over the next few days.  Return to the ER for symptoms that change or worsen if unable to schedule an appointment. 

## 2019-03-26 ENCOUNTER — Telehealth: Payer: Self-pay

## 2019-03-26 NOTE — Telephone Encounter (Signed)
Pap letter mailed. Hx of LEEP. Needs pap now. Aileen Fass, RN

## 2019-04-09 NOTE — Telephone Encounter (Signed)
Patient has appt 04/15/19. Aileen Fass, RN

## 2019-04-15 ENCOUNTER — Ambulatory Visit (LOCAL_COMMUNITY_HEALTH_CENTER): Payer: Self-pay | Admitting: Physician Assistant

## 2019-04-15 ENCOUNTER — Other Ambulatory Visit: Payer: Self-pay

## 2019-04-15 VITALS — BP 130/95 | Ht 66.0 in | Wt 281.6 lb

## 2019-04-15 DIAGNOSIS — Z01419 Encounter for gynecological examination (general) (routine) without abnormal findings: Secondary | ICD-10-CM

## 2019-04-15 DIAGNOSIS — Z3049 Encounter for surveillance of other contraceptives: Secondary | ICD-10-CM

## 2019-04-15 DIAGNOSIS — Z3009 Encounter for other general counseling and advice on contraception: Secondary | ICD-10-CM

## 2019-04-16 ENCOUNTER — Encounter: Payer: Self-pay | Admitting: Physician Assistant

## 2019-04-16 NOTE — Progress Notes (Signed)
Family Planning Visit- Repeat Yearly Visit  Subjective:  Makayla Booker is a 38 y.o. being seen today for an well woman visit and to discuss family planning options.    Makayla Booker is currently using condoms sometimes for pregnancy prevention. Patient reports Makayla Booker does not  want a pregnancy in the next year. Patient  does not have a problem list on file.  Chief Complaint  Patient presents with  . Contraception    Patient reports that Makayla Booker received a letter that said Makayla Booker was due for a pap so Makayla Booker made an appt.  Reports that Makayla Booker was using Depo as BCM but had her last shot in December.  States that Makayla Booker does not want to restart Depo today.  Last sex was 1-2 months ago without a condom.  States that Makayla Booker has been having occasional spotting since d/c Depo but not "real" period yet.  Last RP with [facility] was 02/2018.  States that Makayla Booker has to make an appt with PCP to be able to continue on her BP meds.    Patient denies any vaginal symptoms, concerns and any changes in personal and family history since her last RP.   Does the patient desire a pregnancy in the next year? ([facility] flowsheet)  See flowsheet for other program required questions.   There is no height or weight on file to calculate BMI. - Patient is eligible for diabetes screening based on BMI and age 52>40?  not applicable HA1C ordered? not applicable  Patient reports 1 of partners in last year. Desires STI screening?  Yes  Does the patient have a current or past history of drug use? No   No components found for: HCV]   Health Maintenance Due  Topic Date Due  . HIV Screening  09/20/1995  . TETANUS/TDAP  09/20/1999  . PAP SMEAR-Modifier  09/19/2001  . INFLUENZA VACCINE  03/07/2019    Review of Systems  All other systems reviewed and are negative.   The following portions of the patient's history were reviewed and updated as appropriate: allergies, current medications, past family history, past medical history, past social history, past surgical  history and problem list. Problem list updated.  Objective:  There were no vitals filed for this visit.  Physical Exam Vitals signs reviewed.  Constitutional:      General: Makayla Booker is not in acute distress.    Appearance: Normal appearance. Makayla Booker is obese.  HENT:     Head: Normocephalic and atraumatic.     Mouth/Throat:     Mouth: Mucous membranes are moist.     Pharynx: Oropharynx is clear. No oropharyngeal exudate or posterior oropharyngeal erythema.  Eyes:     Conjunctiva/sclera: Conjunctivae normal.  Neck:     Musculoskeletal: Neck supple.  Pulmonary:     Effort: Pulmonary effort is normal.  Abdominal:     Palpations: Abdomen is soft. There is no mass.     Tenderness: There is no abdominal tenderness. There is no guarding or rebound.  Genitourinary:    General: Normal vulva.     Rectum: Normal.     Comments: External genitalia/pubic area without nits, lice, edema, erythema, lesions or inguinal adenopathy. Vagina with normal mucosa, small amount of whitish discharge, pH=4.5. Cervix without visible lesions. Uterus firm, mobile, nt, no masses, no CMT, no adnexal tenderness or fullness. Lymphadenopathy:     Cervical: No cervical adenopathy.  Skin:    General: Skin is warm and dry.     Findings: No bruising, erythema, lesion or rash.  Neurological:     Mental Status: Makayla Booker is alert and oriented to person, place, and time.  Psychiatric:        Mood and Affect: Mood normal.        Behavior: Behavior normal.        Thought Content: Thought content normal.        Judgment: Judgment normal.       Assessment and Plan:  SHIRLEEN MCFAUL is a 38 y.o. female presenting to the [facility] for afamily planning visit for repeat pap.  Contraception counseling: Reviewed all forms of birth control options available including abstinence; over the counter/barrier methods; hormonal contraceptive medication including pill, patch, ring, injection,contraceptive implant;  hormonal and nonhormonal IUDs; permanent sterilization options including vasectomy and the various tubal sterilization modalities. Risks and benefits reviewed.   Patient desires to use condoms, this was prescribed for patient. Makayla Booker will follow up in  1 year and prn  for surveillance.  Makayla Booker was told to call with any further questions, or with any concerns about this method of contraception.  Emphasized use of condoms 100% of the time for STI prevention.  1. Encounter for counseling regarding contraception Counseled patient as above and patient declines Depo restart and other hormonal BCM today. Rec condoms with all sex.  2. Pap test, as part of routine gynecological examination Counseled that Makayla Booker should receive a letter or call re:  Pap results in 1-2 weeks. Counseled that RN will call if Makayla Booker needs to RTC for any treatment once results of GC/Chlamydia testing is back. Patient declines blood work for HIV and Syphilis today.  - IGP, Aptima HPV - Chlamydia/Gonorrhea [facility]  3. Encounter for surveillance of other contraceptive Counseled re:  Using condoms always and OTC spermicides with condoms for better effectiveness. Rec RTC if changes mind and would like to restart Depo or start other hormonal BCM.      No follow-ups on file.  No future appointments.  Jerene Dilling, PA

## 2019-04-18 LAB — IGP, APTIMA HPV
HPV Aptima: NEGATIVE
PAP Smear Comment: 0

## 2019-04-28 ENCOUNTER — Emergency Department
Admission: EM | Admit: 2019-04-28 | Discharge: 2019-04-28 | Disposition: A | Discharge status: Home / Self Care | Payer: Self-pay | Attending: Emergency Medicine | Admitting: Emergency Medicine

## 2019-04-28 ENCOUNTER — Emergency Department: Payer: Self-pay

## 2019-04-28 ENCOUNTER — Encounter: Payer: Self-pay | Admitting: Emergency Medicine

## 2019-04-28 ENCOUNTER — Other Ambulatory Visit: Payer: Self-pay

## 2019-04-28 DIAGNOSIS — M545 Low back pain, unspecified: Secondary | ICD-10-CM

## 2019-04-28 DIAGNOSIS — I10 Essential (primary) hypertension: Secondary | ICD-10-CM | POA: Insufficient documentation

## 2019-04-28 LAB — URINALYSIS, COMPLETE (UACMP) WITH MICROSCOPIC
Bacteria, UA: NONE SEEN
Bilirubin Urine: NEGATIVE
Glucose, UA: NEGATIVE mg/dL
Ketones, ur: NEGATIVE mg/dL
Leukocytes,Ua: NEGATIVE
Nitrite: NEGATIVE
Protein, ur: NEGATIVE mg/dL
Specific Gravity, Urine: 1.021 (ref 1.005–1.030)
pH: 6 (ref 5.0–8.0)

## 2019-04-28 LAB — POCT PREGNANCY, URINE: Preg Test, Ur: NEGATIVE

## 2019-04-28 MED ORDER — CYCLOBENZAPRINE HCL 10 MG PO TABS: 10.0000 mg | ORAL_TABLET | Freq: Three times a day (TID) | ORAL | 0 refills | Status: DC | PRN

## 2019-04-28 MED ORDER — CYCLOBENZAPRINE HCL 10 MG PO TABS
10.0000 mg | ORAL_TABLET | Freq: Once | ORAL | Status: AC
  Administered 2019-04-28: 10 mg via ORAL
  Filled 2019-04-28: qty 1

## 2019-04-28 MED ORDER — KETOROLAC TROMETHAMINE 30 MG/ML IJ SOLN
30.0000 mg | Freq: Once | INTRAMUSCULAR | Status: AC
  Administered 2019-04-28: 17:00:00 30 mg via INTRAMUSCULAR
  Filled 2019-04-28: qty 1

## 2019-04-28 MED ORDER — AMLODIPINE BESYLATE 10 MG PO TABS: 10.0000 mg | ORAL_TABLET | Freq: Every day | ORAL | 12 refills | Status: DC

## 2019-04-28 MED ORDER — MELOXICAM 15 MG PO TABS: 15.0000 mg | ORAL_TABLET | Freq: Every day | ORAL | 2 refills | Status: DC

## 2019-04-28 NOTE — ED Triage Notes (Signed)
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 Patient states she feels some relief when laying down.

## 2019-04-28 NOTE — Discharge Instructions (Signed)
Follow-up with your regular doctor as needed.  Take your medications as prescribed.  I refilled your blood pressure medication as it is highly important if you take this medicine.  Still follow-up with your regular doctor for your checkup.  If your back is not better he should see your regular doctor.  If you are worsening return emergency department.

## 2019-04-28 NOTE — ED Notes (Signed)
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

## 2019-04-28 NOTE — ED Provider Notes (Signed)
[facility] Emergency Department Provider Note  ____________________________________________   First MD Initiated Contact with Patient 04/28/19 1610     (approximate)  I have reviewed the triage vital signs and the nursing notes.   HISTORY  Chief Complaint Back Pain    HPI Makayla Booker is a 38 y.o. female presents to the ED C/o low back pain for 1 month which increased over the last 3 days, no known injury, pain is worse with movement, increased with bending over, denies numbness, tingling, or changes in bowel/urinary habits,  Using otc meds without relief.  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  And she works part-time at [employer] where she does stand a lot. Remainder ros neg   Past Medical History:  Diagnosis Date  . Hypertension   . Migraines     There are no active problems to display for this patient.   History reviewed. No pertinent surgical history.  Prior to Admission medications   Medication Sig Start Date End Date Taking? Authorizing Provider  amLODipine (NORVASC) 10 MG tablet Take 1 tablet (10 mg total) by mouth daily. 04/28/19   Ziyon Cedotal, Roselyn Bering, PA-C  cyclobenzaprine (FLEXERIL) 10 MG tablet Take 1 tablet (10 mg total) by mouth 3 (three) times daily as needed. 04/28/19   Falisa Lamora, Roselyn Bering, PA-C  meloxicam (MOBIC) 15 MG tablet Take 1 tablet (15 mg total) by mouth daily. 04/28/19 04/27/20  Faythe Ghee, PA-C    Allergies Penicillins  Family History  Problem Relation Age of Onset  . Cancer Maternal Grandmother     Social History Social History   Tobacco Use  . Smoking status: Never Smoker  . Smokeless tobacco: Never Used  Substance Use Topics  . Alcohol use: Yes  . Drug use: No    Review of Systems  Constitutional: No fever/chills Eyes: No visual changes. ENT: No sore throat. Respiratory: Denies cough Genitourinary: Negative for dysuria. Musculoskeletal: Positive for back pain. Skin: Negative for  rash.    ____________________________________________   PHYSICAL EXAM:  VITAL SIGNS: ED Triage Vitals  Enc Vitals Group     BP 04/28/19 1558 (!) 170/104     Pulse Rate 04/28/19 1558 93     Resp 04/28/19 1558 16     Temp 04/28/19 1558 98.5 F (36.9 C)     Temp Source 04/28/19 1558 Oral     SpO2 04/28/19 1558 99 %     Weight 04/28/19 1600 286 lb (129.7 kg)     Height 04/28/19 1600 5\' 8"  (1.727 m)     Head Circumference --      Peak Flow --      Pain Score 04/28/19 1559 10     Pain Loc --      Pain Edu? --      Excl. in GC? --     Constitutional: Alert and oriented. Well appearing and in no acute distress. Eyes: Conjunctivae are normal.  Head: Atraumatic. Nose: No congestion/rhinnorhea. Mouth/Throat: Mucous membranes are moist.   Neck:  supple no lymphadenopathy noted Cardiovascular: Normal rate, regular rhythm. Heart sounds are normal Respiratory: Normal respiratory effort.  No retractions, lungs c t a  Abd: soft nontender bs normal all 4 quad GU: deferred Musculoskeletal: FROM all extremities, warm and well perfused.  Decreased rom of back due to discomfort, lumbar spine tender at the lower lumbar spine and SI joints bilaterally, negative slr, 5/5 strength in great toes b/l, 5/5 strength in lower legs,  n/v intact Neurologic:  Normal speech and language.  Skin:  Skin is warm, dry and intact. No rash noted. Psychiatric: Mood and affect are normal. Speech and behavior are normal.  ____________________________________________   LABS (all labs ordered are listed, but only abnormal results are displayed)  Labs Reviewed  URINALYSIS, COMPLETE (UACMP) WITH MICROSCOPIC - Abnormal; Notable for the following components:      Result Value   Color, Urine YELLOW (*)    APPearance CLOUDY (*)    Hgb urine dipstick SMALL (*)    All other components within normal limits  POC URINE PREG, ED  POCT PREGNANCY, URINE   ____________________________________________    ____________________________________________  RADIOLOGY  X-ray lumbar spine is normal  ____________________________________________   PROCEDURES  Procedure(s) performed: Toradol 30 mg IM, Flexeril 10 mg p.o.   Procedures    ____________________________________________   INITIAL IMPRESSION / ASSESSMENT AND PLAN / ED COURSE  Pertinent labs & imaging results that were available during my care of the patient were reviewed by me and considered in my medical decision making (see chart for details).   Patient is 38 year old female presents emergency department with low back pain.  See HPI  Physical exam shows lumbar spine to be slightly tender.  POC pregnancy negative, urinalysis is normal, x-ray of the lumbar spine is normal  Explained all these findings to the patient.  She was given a prescription for meloxicam and Flexeril.  She is to follow-up with her regular doctor concerning her blood pressure.  Due to her being out of her blood pressure medication I refilled her amlodipine.  Explained to her it is very important to stay on this medication so she does not have a stroke, heart attack, or have kidney damage.  She states she understands will comply.  She was discharged in stable condition.   Patient was seen and evaluated during the [place] pandemic.  All laws rules and regulations concerning this were evaluated during her visit.  Patient did not show any signs of covid.  As part of my medical decision making, I reviewed the following data within the [facility] notes reviewed and incorporated, Old chart reviewed, Radiograph reviewed x-ray lumbar spine is normal, Notes from prior ED visits and [place] Controlled Substance Database  ____________________________________________   FINAL CLINICAL IMPRESSION(S) / ED DIAGNOSES  Final diagnoses:  Acute midline low back pain without sciatica      NEW MEDICATIONS STARTED DURING THIS VISIT:  New Prescriptions    CYCLOBENZAPRINE (FLEXERIL) 10 MG TABLET    Take 1 tablet (10 mg total) by mouth 3 (three) times daily as needed.   MELOXICAM (MOBIC) 15 MG TABLET    Take 1 tablet (15 mg total) by mouth daily.     Note:  This document was prepared using Dragon voice recognition software and may include unintentional dictation errors.     Versie Starks, PA-C 04/28/19 1814    Vanessa Mitchell, MD 04/28/19 [phone]

## 2019-05-17 ENCOUNTER — Other Ambulatory Visit: Payer: Self-pay

## 2019-05-17 ENCOUNTER — Emergency Department
Admission: EM | Admit: 2019-05-17 | Discharge: 2019-05-17 | Disposition: A | Discharge status: Home / Self Care | Payer: Self-pay | Attending: Emergency Medicine | Admitting: Emergency Medicine

## 2019-05-17 DIAGNOSIS — Z79899 Other long term (current) drug therapy: Secondary | ICD-10-CM | POA: Insufficient documentation

## 2019-05-17 DIAGNOSIS — R600 Localized edema: Secondary | ICD-10-CM | POA: Insufficient documentation

## 2019-05-17 DIAGNOSIS — R609 Edema, unspecified: Secondary | ICD-10-CM

## 2019-05-17 DIAGNOSIS — I10 Essential (primary) hypertension: Secondary | ICD-10-CM | POA: Insufficient documentation

## 2019-05-17 LAB — COMPREHENSIVE METABOLIC PANEL
ALT: 22 U/L (ref 0–44)
AST: 23 U/L (ref 15–41)
Albumin: 4.3 g/dL (ref 3.5–5.0)
Alkaline Phosphatase: 64 U/L (ref 38–126)
Anion gap: 9 (ref 5–15)
BUN: 15 mg/dL (ref 6–20)
CO2: 26 mmol/L (ref 22–32)
Calcium: 9.1 mg/dL (ref 8.9–10.3)
Chloride: 101 mmol/L (ref 98–111)
Creatinine, Ser: 0.65 mg/dL (ref 0.44–1.00)
GFR calc Af Amer: >60 mL/min (ref >60)
GFR calc non Af Amer: >60 mL/min (ref >60)
Glucose, Bld: 101 mg/dL — ABNORMAL HIGH (ref 70–99)
Potassium: 3.8 mmol/L (ref 3.5–5.1)
Sodium: 136 mmol/L (ref 135–145)
Total Bilirubin: 0.8 mg/dL (ref 0.3–1.2)
Total Protein: 8.5 g/dL — ABNORMAL HIGH (ref 6.5–8.1)

## 2019-05-17 LAB — CBC
HCT: 39.5 % (ref 36.0–46.0)
Hemoglobin: 12.8 g/dL (ref 12.0–15.0)
MCH: 31.4 pg (ref 26.0–34.0)
MCHC: 32.4 g/dL (ref 30.0–36.0)
MCV: 97.1 fL (ref 80.0–100.0)
Platelets: 362 10*3/uL (ref 150–400)
RBC: 4.07 MIL/uL (ref 3.87–5.11)
RDW: 12.4 % (ref 11.5–15.5)
WBC: 6.6 10*3/uL (ref 4.0–10.5)
nRBC: 0 % (ref 0.0–0.2)

## 2019-05-17 LAB — FIBRIN DERIVATIVES D-DIMER (ARMC ONLY): Fibrin derivatives D-dimer (ARMC): 219.98 ng/mL (FEU) (ref 0.00–499.00)

## 2019-05-17 LAB — BRAIN NATRIURETIC PEPTIDE: B Natriuretic Peptide: 6 pg/mL (ref 0.0–100.0)

## 2019-05-17 MED ORDER — IBUPROFEN 600 MG PO TABS
600.0000 mg | ORAL_TABLET | Freq: Once | ORAL | Status: AC
  Administered 2019-05-17: 12:00:00 600 mg via ORAL
  Filled 2019-05-17: qty 1

## 2019-05-17 NOTE — ED Provider Notes (Signed)
[facility] Emergency Department Provider Note   ____________________________________________   I have reviewed the triage vital signs and the nursing notes.   HISTORY  Chief Complaint Leg Swelling   History limited by: Not Limited   HPI Makayla Booker is a 38 y.o. female who presents to the emergency department today because of concerns for leg swelling.  She states the swelling has been present for the past 2 weeks.  It has gotten gradually worse.  She feels like it is coming up her right leg more than her left leg.  She is complaining of pain to the tops of her feet.  She has tried propping her legs up without any significant relief of the swelling.  The patient says that she has had swelling in the past that it usually gets better when she props her legs up.  She denies any chest pain or shortness of breath.  Records reviewed. Per medical record review patient has a history of HTN, migraines.  Past Medical History:  Diagnosis Date  . Hypertension   . Migraines     There are no active problems to display for this patient.   No past surgical history on file.  Prior to Admission medications   Medication Sig Start Date End Date Taking? Authorizing Provider  amLODipine (NORVASC) 10 MG tablet Take 1 tablet (10 mg total) by mouth daily. 04/28/19   Fisher, Linden Dolin, PA-C  cyclobenzaprine (FLEXERIL) 10 MG tablet Take 1 tablet (10 mg total) by mouth 3 (three) times daily as needed. 04/28/19   Fisher, Linden Dolin, PA-C  meloxicam (MOBIC) 15 MG tablet Take 1 tablet (15 mg total) by mouth daily. 04/28/19 04/27/20  Versie Starks, PA-C    Allergies Penicillins  Family History  Problem Relation Age of Onset  . Cancer Maternal Grandmother     Social History Social History   Tobacco Use  . Smoking status: Never Smoker  . Smokeless tobacco: Never Used  Substance Use Topics  . Alcohol use: Yes  . Drug use: No    Review of Systems Constitutional: No  fever/chills Eyes: No visual changes. ENT: No sore throat. Cardiovascular: Denies chest pain. Respiratory: Denies shortness of breath. Gastrointestinal: No abdominal pain.  No nausea, no vomiting.  No diarrhea.   Genitourinary: Negative for dysuria. Musculoskeletal: Bilateral leg swelling, right foot pain. Skin: Negative for rash. Neurological: Negative for headaches, focal weakness or numbness.  ____________________________________________   PHYSICAL EXAM:  VITAL SIGNS: ED Triage Vitals  Enc Vitals Group     BP 05/17/19 0859 (!) 153/71     Pulse Rate 05/17/19 0859 86     Resp 05/17/19 0859 18     Temp 05/17/19 0859 97.8 F (36.6 C)     Temp Source 05/17/19 0859 Oral     SpO2 05/17/19 0859 100 %     Weight 05/17/19 0900 286 lb (129.7 kg)     Height 05/17/19 0900 5\' 8"  (1.727 m)     Head Circumference --      Peak Flow --      Pain Score 05/17/19 0859 9   Constitutional: Alert and oriented.  Eyes: Conjunctivae are normal.  ENT      Head: Normocephalic and atraumatic.      Nose: No congestion/rhinnorhea.      Mouth/Throat: Mucous membranes are moist.      Neck: No stridor. Hematological/Lymphatic/Immunilogical: No cervical lymphadenopathy. Cardiovascular: Normal rate, regular rhythm.  No murmurs, rubs, or gallops.  Respiratory: Normal  respiratory effort without tachypnea nor retractions. Breath sounds are clear and equal bilaterally. No wheezes/rales/rhonchi. Gastrointestinal: Soft and non tender. No rebound. No guarding.  Genitourinary: Deferred Musculoskeletal: Normal range of motion in all extremities. Bilateral lower extremity non pitting edema.  Neurologic:  Normal speech and language. No gross focal neurologic deficits are appreciated.  Skin:  Skin is warm, dry and intact. No rash noted. Psychiatric: Mood and affect are normal. Speech and behavior are normal. Patient exhibits appropriate insight and judgment.  ____________________________________________     LABS (pertinent positives/negatives)  CBC wbc 6.6, hgb 12.8, plt 362 CMP wnl except glu 101, t pro 8.5 BNP 6.0 D-dimer 219.98 ____________________________________________   EKG  None  ____________________________________________    RADIOLOGY  None  ____________________________________________   PROCEDURES  Procedures  ____________________________________________   INITIAL IMPRESSION / ASSESSMENT AND PLAN / ED COURSE  Pertinent labs & imaging results that were available during my care of the patient were reviewed by me and considered in my medical decision making (see chart for details).   Patient presented to the emergency department today because of concerns for peripheral edema.  Patient's work-up did not show evidence of kidney failure, liver failure, heart failure or blood clot.  I did discuss these results with the patient.  Did discuss using compression stockings.  Will give patient primary care follow-up.   ____________________________________________   FINAL CLINICAL IMPRESSION(S) / ED DIAGNOSES  Final diagnoses:  Peripheral edema     Note: This dictation was prepared with Dragon dictation. Any transcriptional errors that result from this process are unintentional     Phineas Semen, MD 05/17/19 1243

## 2019-05-17 NOTE — ED Notes (Signed)
This RN introduced self to pt. Pt states she has noticed increase swelling in lower legs and feet. Upon palpation pt states increase pain and tenderness to the top of her feet. Pt able to wiggle her toes. Pt denies numbness and tingling. 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 Pt states she notices increased swelling when she sits all day. Pt states the pain is 9/10.

## 2019-05-17 NOTE — ED Triage Notes (Signed)
First Nurse Note:  C/O swelling to feet bilaterally x 2 weeks and states right leg is now swollen.  AAOx3.  Skin warm and dry. NAD

## 2019-05-17 NOTE — Discharge Instructions (Signed)
As we discussed please try using compression stockings. Please seek medical attention for any high fevers, chest pain, shortness of breath, change in behavior, persistent vomiting, bloody stool or any other new or concerning symptoms.

## 2019-05-17 NOTE — ED Notes (Signed)
ED Provider at bedside. 

## 2019-05-17 NOTE — ED Triage Notes (Signed)
Pt states that for the past 2 weeks she has had an increase in bilat lower leg and feet swelling, pt states now it is extending into her right knee, pt states that she started bp meds a few weeks ago and has noticed it getting worse since. Pt states that she also has been propping herself up at night to sleep and she doesn't usually do that. No distress noted at this time, pt denies sob. bilat lower extremity swelling noted no pitting noted

## 2019-06-15 ENCOUNTER — Ambulatory Visit: Payer: Self-pay

## 2019-06-26 ENCOUNTER — Telehealth: Payer: Self-pay | Admitting: Pharmacy Technician

## 2019-06-26 NOTE — Telephone Encounter (Signed)
Patient failed to provide 2020 poi  No additional medication assistance will be provided by [facility] without the required proof of income documentation.  Patient notified.  Chrstopher Malenfant J. Azriella Mattia Care Manager [facility] 

## 2019-07-20 ENCOUNTER — Emergency Department
Admission: EM | Admit: 2019-07-20 | Discharge: 2019-07-20 | Disposition: A | Discharge status: Home / Self Care | Payer: Self-pay | Attending: Emergency Medicine | Admitting: Emergency Medicine

## 2019-07-20 ENCOUNTER — Other Ambulatory Visit: Payer: Self-pay

## 2019-07-20 DIAGNOSIS — Z79899 Other long term (current) drug therapy: Secondary | ICD-10-CM | POA: Insufficient documentation

## 2019-07-20 DIAGNOSIS — M545 Low back pain, unspecified: Secondary | ICD-10-CM

## 2019-07-20 DIAGNOSIS — I10 Essential (primary) hypertension: Secondary | ICD-10-CM | POA: Insufficient documentation

## 2019-07-20 DIAGNOSIS — G8929 Other chronic pain: Secondary | ICD-10-CM | POA: Insufficient documentation

## 2019-07-20 LAB — URINALYSIS, COMPLETE (UACMP) WITH MICROSCOPIC
Bilirubin Urine: NEGATIVE
Glucose, UA: NEGATIVE mg/dL
Ketones, ur: NEGATIVE mg/dL
Leukocytes,Ua: NEGATIVE
Nitrite: NEGATIVE
Protein, ur: 30 mg/dL — AB
Specific Gravity, Urine: 1.035 — ABNORMAL HIGH (ref 1.005–1.030)
pH: 5 (ref 5.0–8.0)

## 2019-07-20 LAB — POCT PREGNANCY, URINE: Preg Test, Ur: NEGATIVE

## 2019-07-20 MED ORDER — OXYCODONE-ACETAMINOPHEN 5-325 MG PO TABS
1.0000 | ORAL_TABLET | Freq: Once | ORAL | Status: AC
  Administered 2019-07-20: 17:00:00 1 via ORAL
  Filled 2019-07-20: qty 1

## 2019-07-20 MED ORDER — PREDNISONE 20 MG PO TABS
60.0000 mg | ORAL_TABLET | Freq: Once | ORAL | Status: AC
  Administered 2019-07-20: 60 mg via ORAL
  Filled 2019-07-20: qty 3

## 2019-07-20 MED ORDER — PREDNISONE 10 MG (21) PO TBPK: ORAL_TABLET | ORAL | 0 refills | Status: DC

## 2019-07-20 NOTE — ED Provider Notes (Signed)
Emergency Department Provider Note  ____________________________________________  Time seen: Approximately 5:23 PM  I have reviewed the triage vital signs and the nursing notes.   HISTORY  Chief Complaint Back Pain   Historian Patient     HPI Makayla Booker is a 38 y.o. female presents to the emergency department with acute on chronic low back pain.  Patient states that she has had low back pain for a long time and was recently seen and evaluated for it a month ago.  Patient states she has been taking anti-inflammatories and muscle relaxers at home but these medicines have not improved her pain.  She denies dysuria, hematuria or radiation of pain.  She reports that her pain worsens with ambulation and with standing and is affecting her ability to work.   Past Medical History:  Diagnosis Date  . Hypertension   . Migraines      Immunizations up to date:  Yes.     Past Medical History:  Diagnosis Date  . Hypertension   . Migraines     There are no problems to display for this patient.   History reviewed. No pertinent surgical history.  Prior to Admission medications   Medication Sig Start Date End Date Taking? Authorizing Provider  amLODipine (NORVASC) 10 MG tablet Take 1 tablet (10 mg total) by mouth daily. 04/28/19   Fisher, Roselyn Bering, PA-C  cyclobenzaprine (FLEXERIL) 10 MG tablet Take 1 tablet (10 mg total) by mouth 3 (three) times daily as needed. 04/28/19   Fisher, Roselyn Bering, PA-C  meloxicam (MOBIC) 15 MG tablet Take 1 tablet (15 mg total) by mouth daily. 04/28/19 04/27/20  Fisher, Roselyn Bering, PA-C  predniSONE (STERAPRED UNI-PAK 21 TAB) 10 MG (21) TBPK tablet Take 6 tablets the first day, take 5 tablets the second day, take 4 tablets the third day, take 3 tablets the fourth day, take 2 tablets the fifth day, take 1 tablet the sixth day. 07/20/19   Orvil Feil, PA-C    Allergies Penicillins  Family History  Problem Relation Age of Onset  . Cancer Maternal  Grandmother     Social History Social History   Tobacco Use  . Smoking status: Never Smoker  . Smokeless tobacco: Never Used  Substance Use Topics  . Alcohol use: Yes  . Drug use: No     Review of Systems  Constitutional: No fever/chills Eyes:  No discharge ENT: No upper respiratory complaints. Respiratory: no cough. No SOB/ use of accessory muscles to breath Gastrointestinal:   No nausea, no vomiting.  No diarrhea.  No constipation. Musculoskeletal: Patient has low back pain.  Skin: Negative for rash, abrasions, lacerations, ecchymosis.  ____________________________________________   PHYSICAL EXAM:  VITAL SIGNS: ED Triage Vitals  Enc Vitals Group     BP 07/20/19 1553 (!) 136/96     Pulse Rate 07/20/19 1553 86     Resp 07/20/19 1553 17     Temp 07/20/19 1553 97.9 F (36.6 C)     Temp Source 07/20/19 1553 Oral     SpO2 07/20/19 1553 100 %     Weight 07/20/19 1551 286 lb (129.7 kg)     Height 07/20/19 1551 5\' 8"  (1.727 m)     Head Circumference --      Peak Flow --      Pain Score 07/20/19 1550 10     Pain Loc --      Pain Edu? --      Excl. in GC? --  Constitutional: Alert and oriented. Well appearing and in no acute distress. Eyes: Conjunctivae are normal. PERRL. EOMI. Head: Atraumatic. ENT: Cardiovascular: Normal rate, regular rhythm. Normal S1 and S2.  Good peripheral circulation. Respiratory: Normal respiratory effort without tachypnea or retractions. Lungs CTAB. Good air entry to the bases with no decreased or absent breath sounds Gastrointestinal: Bowel sounds x 4 quadrants. Soft and nontender to palpation. No guarding or rigidity. No distention. Musculoskeletal: Full range of motion to all extremities. No obvious deformities noted.  Patient has paraspinal muscle tenderness along the lumbar spine.  Negative straight leg raise bilaterally. Neurologic:  Normal for age. No gross focal neurologic deficits are appreciated.  Skin:  Skin is warm, dry and  intact. No rash noted. Psychiatric: Mood and affect are normal for age. Speech and behavior are normal.   ____________________________________________   LABS (all labs ordered are listed, but only abnormal results are displayed)  Labs Reviewed  URINALYSIS, COMPLETE (UACMP) WITH MICROSCOPIC - Abnormal; Notable for the following components:      Result Value   Color, Urine YELLOW (*)    APPearance HAZY (*)    Specific Gravity, Urine 1.035 (*)    Hgb urine dipstick SMALL (*)    Protein, ur 30 (*)    Bacteria, UA RARE (*)    All other components within normal limits  POC URINE PREG, ED  POCT PREGNANCY, URINE   ____________________________________________  EKG   ____________________________________________  RADIOLOGY   No results found.  ____________________________________________    PROCEDURES  Procedure(s) performed:     Procedures     Medications  predniSONE (DELTASONE) tablet 60 mg (has no administration in time range)  oxyCODONE-acetaminophen (PERCOCET/ROXICET) 5-325 MG per tablet 1 tablet (has no administration in time range)     ____________________________________________   INITIAL IMPRESSION / ASSESSMENT AND PLAN / ED COURSE  Pertinent labs & imaging results that were available during my care of the patient were reviewed by me and considered in my medical decision making (see chart for details).      Assessment and plan Low back pain 38 year old female presents to the emergency department with acute on chronic low back pain.  Patient was mildly hypertensive at triage vital signs were otherwise reassuring.  Patient had some paraspinal muscle tenderness along the lumbar spine.  Patient had a small amount of blood in her urine which is to be expected as patient is currently menstruating but no other signs of cystitis.  Patient was discharged with a prednisone taper and advised to follow-up with neurosurgery if pain persist.  All patient questions  were answered.    ____________________________________________  FINAL CLINICAL IMPRESSION(S) / ED DIAGNOSES  Final diagnoses:  Acute bilateral low back pain without sciatica      NEW MEDICATIONS STARTED DURING THIS VISIT:  ED Discharge Orders         Ordered    predniSONE (STERAPRED UNI-PAK 21 TAB) 10 MG (21) TBPK tablet     07/20/19 1716              This chart was dictated using voice recognition software/Dragon. Despite best efforts to proofread, errors can occur which can change the meaning. Any change was purely unintentional.     Lannie Fields, PA-C 07/20/19 1730    Nance Pear, MD 07/20/19 [id number]

## 2019-07-20 NOTE — ED Triage Notes (Signed)
Pt c/o lower back pain for a while and has been seen her a couple times for it, states she has been taking so much medication for pain to get through work it makes her nauseous

## 2019-08-17 ENCOUNTER — Ambulatory Visit: Payer: Self-pay

## 2019-08-19 ENCOUNTER — Encounter: Payer: Self-pay | Admitting: Physician Assistant

## 2019-08-19 ENCOUNTER — Other Ambulatory Visit: Payer: Self-pay

## 2019-08-19 ENCOUNTER — Ambulatory Visit (LOCAL_COMMUNITY_HEALTH_CENTER): Payer: Self-pay | Admitting: Physician Assistant

## 2019-08-19 VITALS — BP 133/87 | Ht 68.0 in | Wt 287.2 lb

## 2019-08-19 DIAGNOSIS — Z3042 Encounter for surveillance of injectable contraceptive: Secondary | ICD-10-CM

## 2019-08-19 DIAGNOSIS — Z30013 Encounter for initial prescription of injectable contraceptive: Secondary | ICD-10-CM

## 2019-08-19 DIAGNOSIS — Z0000 Encounter for general adult medical examination without abnormal findings: Secondary | ICD-10-CM

## 2019-08-19 DIAGNOSIS — Z3009 Encounter for other general counseling and advice on contraception: Secondary | ICD-10-CM

## 2019-08-19 MED ORDER — MEDROXYPROGESTERONE ACETATE 150 MG/ML IM SUSP
150.0000 mg | INTRAMUSCULAR | Status: AC
  Administered 2019-08-19 – 2020-05-24 (×4): 150 mg via INTRAMUSCULAR

## 2019-08-19 NOTE — Progress Notes (Signed)
Patient wants to restart Depo today. Next pap due 2023, last PE 02/2018.Marland KitchenBurt Knack, RN

## 2019-08-19 NOTE — Progress Notes (Signed)
Depo given, reminder card card. Depo consent signed.Burt Knack, RN

## 2019-08-22 ENCOUNTER — Encounter: Payer: Self-pay | Admitting: Physician Assistant

## 2019-08-22 NOTE — Progress Notes (Signed)
Family Planning Visit- Repeat Yearly Visit  Subjective:  Makayla Booker is a 39 y.o. being seen today for an well woman visit and to restart Depo.    She is currently using none for pregnancy prevention. Patient reports she does not  want a pregnancy in the next year. Patient  does not have a problem list on file.  Chief Complaint  Patient presents with  . Contraception    wants to restart Depo  . Annual Exam    Patient reports that she has been having 2 periods a month.  Had a period that ended 07/26/2019 and another started 08/08/2019 and ended 08/15/2019.  Does not desire a pregnancy and would like to restart Depo.  Only sex in last 2 weeks was last pm and without condoms.  Would like Depo in her hip.  Patient denies any changes to her personal and family history.  Denies concerns or symptoms today.   Does the patient desire a pregnancy in the next year? (OKQ flowsheet)  See flowsheet for other program required questions.   Body mass index is 43.67 kg/m. - Patient is eligible for diabetes screening based on BMI and age >19?  not applicable JK9T ordered? not applicable  Patient reports 1 of partners in last year. Desires STI screening?  No - .  Does the patient have a current or past history of drug use? No   No components found for: HCV]   Health Maintenance Due  Topic Date Due  . HIV Screening  09/20/1995  . TETANUS/TDAP  09/20/1999  . INFLUENZA VACCINE  03/07/2019    Review of Systems  All other systems reviewed and are negative.   The following portions of the patient's history were reviewed and updated as appropriate: allergies, current medications, past family history, past medical history, past social history, past surgical history and problem list. Problem list updated.  Objective:   Vitals:   08/19/19 1054  BP: 133/87  Weight: 287 lb 3.2 oz (130.3 kg)  Height: 5\' 8"  (1.727 m)    Physical Exam Vitals and nursing note reviewed.  Constitutional:    General: She is not in acute distress.    Appearance: Normal appearance. She is obese.  HENT:     Head: Normocephalic and atraumatic.  Eyes:     Conjunctiva/sclera: Conjunctivae normal.  Cardiovascular:     Rate and Rhythm: Normal rate and regular rhythm.  Pulmonary:     Effort: Pulmonary effort is normal.     Breath sounds: Normal breath sounds.  Abdominal:     Palpations: Abdomen is soft. There is no mass.     Tenderness: There is no abdominal tenderness. There is no guarding or rebound.  Musculoskeletal:     Cervical back: Neck supple. No tenderness.  Skin:    General: Skin is warm and dry.  Neurological:     Mental Status: She is alert and oriented to person, place, and time.  Psychiatric:        Mood and Affect: Mood normal.        Behavior: Behavior normal.        Thought Content: Thought content normal.        Judgment: Judgment normal.       Assessment and Plan:  SHEARON Booker is a 39 y.o. female presenting to the [facility] Department for a family planning visit  Contraception counseling: Reviewed all forms of birth control options in the tiered based approach. available including abstinence; over the  counter/barrier methods; hormonal contraceptive medication including pill, patch, ring, injection,contraceptive implant; hormonal and nonhormonal IUDs; permanent sterilization options including vasectomy and the various tubal sterilization modalities. Risks, benefits, and typical effectiveness rates were reviewed.  Questions were answered.  Written information was also given to the patient to review.  Patient desires to restart Depo, this was prescribed for patient. She will follow up in  3 months and prn for surveillance.  She was told to call with any further questions, or with any concerns about this method of contraception.  Emphasized use of condoms 100% of the time for STI prevention.  Patient was offered ECP. ECP was not accepted by the patient. ECP  counseling was given.   1. Encounter for counseling regarding contraception Reviewed with patient SE of Depo and when to call clinic for irregular bleeding. Rec condoms with all sex for 2 weeks after shot today, check OTC pregnancy test in 2 weeks and RTC for follow up if positive. - medroxyPROGESTERone (DEPO-PROVERA) injection 150 mg  2. Well adult exam Reviewed with patient Routine Health Survey. Rec well balanced diet, regular exercise, and MVI 1 po daily for general health. Rec continue follow up with PCP/establish with PCP for chronic conditions, illness and primary care concerns.  3. Surveillance for Depo-Provera contraception OK to restart Depo 150mg  IM q 11-13 weeks for 1 year. - medroxyPROGESTERone (DEPO-PROVERA) injection 150 mg     No follow-ups on file.  No future appointments.  , PA

## 2019-09-16 ENCOUNTER — Other Ambulatory Visit: Payer: Self-pay

## 2019-09-16 ENCOUNTER — Encounter: Payer: Self-pay | Admitting: *Deleted

## 2019-09-16 ENCOUNTER — Emergency Department
Admission: EM | Admit: 2019-09-16 | Discharge: 2019-09-16 | Disposition: A | Discharge status: Home / Self Care | Payer: Self-pay | Attending: Emergency Medicine | Admitting: Emergency Medicine

## 2019-09-16 DIAGNOSIS — Z79899 Other long term (current) drug therapy: Secondary | ICD-10-CM | POA: Insufficient documentation

## 2019-09-16 DIAGNOSIS — I10 Essential (primary) hypertension: Secondary | ICD-10-CM | POA: Insufficient documentation

## 2019-09-16 DIAGNOSIS — G8929 Other chronic pain: Secondary | ICD-10-CM | POA: Insufficient documentation

## 2019-09-16 DIAGNOSIS — M79605 Pain in left leg: Secondary | ICD-10-CM | POA: Insufficient documentation

## 2019-09-16 DIAGNOSIS — M545 Low back pain: Secondary | ICD-10-CM | POA: Insufficient documentation

## 2019-09-16 MED ORDER — HYDROCODONE-ACETAMINOPHEN 5-325 MG PO TABS: 1.0000 | ORAL_TABLET | Freq: Three times a day (TID) | ORAL | 0 refills | Status: AC | PRN

## 2019-09-16 MED ORDER — ORPHENADRINE CITRATE 30 MG/ML IJ SOLN
60.0000 mg | INTRAMUSCULAR | Status: AC
  Administered 2019-09-16: 22:00:00 60 mg via INTRAMUSCULAR
  Filled 2019-09-16: qty 2

## 2019-09-16 MED ORDER — KETOROLAC TROMETHAMINE 10 MG PO TABS: 10.0000 mg | ORAL_TABLET | Freq: Three times a day (TID) | ORAL | 0 refills | Status: DC

## 2019-09-16 MED ORDER — NABUMETONE 750 MG PO TABS: 750.0000 mg | ORAL_TABLET | Freq: Two times a day (BID) | ORAL | 0 refills | Status: AC | PRN

## 2019-09-16 MED ORDER — CYCLOBENZAPRINE HCL 5 MG PO TABS: 5.0000 mg | ORAL_TABLET | Freq: Three times a day (TID) | ORAL | 0 refills | Status: DC | PRN

## 2019-09-16 MED ORDER — KETOROLAC TROMETHAMINE 30 MG/ML IJ SOLN
30.0000 mg | Freq: Once | INTRAMUSCULAR | Status: AC
  Administered 2019-09-16: 30 mg via INTRAMUSCULAR
  Filled 2019-09-16: qty 1

## 2019-09-16 NOTE — Discharge Instructions (Addendum)
Your exam is normal at this time. Take the prescription meds as directed. Follow-up with your provider at [facility] as scheduled. Take all of the Ketorolac (Toradol) pain medicine as scheduled, before starting the Nabumetone (Relafen).

## 2019-09-16 NOTE — ED Notes (Signed)
Pt c/o left leg pain that starts at her hip and goes to her feet. C/o numbness and tingling. Pain started yesterday.   Also c/o back pain that started yesterday d/t "disc problems". Pt ambulatory to treatment room.  Pt in NAD at this time.

## 2019-09-16 NOTE — ED Triage Notes (Signed)
Pt has pain in left leg radiating into the foot.  Pt also has back pain.  No known injury.  No urinary sx.  Pt alert  Speech clear.

## 2019-09-16 NOTE — ED Provider Notes (Signed)
[facility] Emergency Department Provider Note ____________________________________________  Time seen: 2155  I have reviewed the triage vital signs and the nursing notes.  HISTORY  Chief Complaint  Back Pain and Leg Pain  HPI Makayla Booker is a 39 y.o. female presents herself to the ED for evaluation of acute of acute on chronic low back pain with some left leg pain.  Patient gives remote history of chronic back pain previously treated with injections.  She is unaware of any specific discogenic disease, and is not clear on the type of injection she had previously received.  She presents today with 2 to 3 days of left leg pain describing pain to the posterior lateral thigh as well as some numbness to the anterior thigh.  She denies any bladder bowel incontinence, foot drop, or weakness.  Patient is taking ibuprofen in the interim denies any significant benefit.  She is been seen in the past for the same complaints, and notes that her symptoms were temporarily alleviated by steroids.  She also reports that she is currently established with [facility] for charity care there, and has her initial evaluation on Friday.  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  She describes her symptoms affect her ability to perform her work activities.  She denies any injury, accident, trauma, or fall.   Past Medical History:  Diagnosis Date  . Hypertension   . Migraines     There are no problems to display for this patient.   History reviewed. No pertinent surgical history.  Prior to Admission medications   Medication Sig Start Date End Date Taking? Authorizing Provider  amLODipine (NORVASC) 10 MG tablet Take 1 tablet (10 mg total) by mouth daily. 04/28/19   Fisher, Linden Dolin, PA-C  cyclobenzaprine (FLEXERIL) 5 MG tablet Take 1 tablet (5 mg total) by mouth 3 (three) times daily as needed. 09/16/19   Michiel Sivley, Dannielle Karvonen, PA-C   HYDROcodone-acetaminophen (NORCO) 5-325 MG tablet Take 1 tablet by mouth 3 (three) times daily as needed for up to 3 days. 09/16/19 09/19/19  Autry Droege, Dannielle Karvonen, PA-C  ketorolac (TORADOL) 10 MG tablet Take 1 tablet (10 mg total) by mouth every 8 (eight) hours. 09/16/19   Yazaira Speas, Dannielle Karvonen, PA-C  nabumetone (RELAFEN) 750 MG tablet Take 1 tablet (750 mg total) by mouth 2 (two) times daily as needed for mild pain. 09/16/19 10/16/19  Karry Barrilleaux, Dannielle Karvonen, PA-C    Allergies Penicillins  Family History  Problem Relation Age of Onset  . Cancer Maternal Grandmother   . Diabetes Mother   . Hypertension Mother   . Heart disease Paternal Aunt     Social History Social History   Tobacco Use  . Smoking status: Never Smoker  . Smokeless tobacco: Never Used  Substance Use Topics  . Alcohol use: Not Currently  . Drug use: No    Review of Systems  Constitutional: Negative for fever. Cardiovascular: Negative for chest pain. Respiratory: Negative for shortness of breath. Gastrointestinal: Negative for abdominal pain, vomiting and diarrhea. Genitourinary: Negative for dysuria. Musculoskeletal: Positive for back pain.  Positive for left leg pain as above. Skin: Negative for rash. Neurological: Negative for headaches, focal weakness or numbness. ____________________________________________  PHYSICAL EXAM:  VITAL SIGNS: ED Triage Vitals  Enc Vitals Group     BP 09/16/19 2051 (!) 157/98     Pulse Rate 09/16/19 2051 82     Resp 09/16/19 2051 20  Temp 09/16/19 2051 98 F (36.7 C)     Temp Source 09/16/19 2051 Oral     SpO2 09/16/19 2051 95 %     Weight 09/16/19 2049 280 lb (127 kg)     Height 09/16/19 2049 5\' 8"  (1.727 m)     Head Circumference --      Peak Flow --      Pain Score 09/16/19 2048 10     Pain Loc --      Pain Edu? --      Excl. in GC? --     Constitutional: Alert and oriented. Well appearing and in no distress. Head: Normocephalic and atraumatic. Eyes:  Conjunctivae are normal. Normal extraocular movements Cardiovascular: Normal rate, regular rhythm. Normal distal pulses. Respiratory: Normal respiratory effort. No wheezes/rales/rhonchi. Gastrointestinal: Soft and nontender. No distention.  No CVA tenderness elicited. Musculoskeletal: Normal spinal alignment without midline tenderness, spasm, deformity, or step-off.  Patient with normal hip flexion extension range on exam.  Nontender with normal range of motion in all extremities.  Neurologic: Cranial nerves II through XII grossly intact.  Normal LE DTRs bilaterally.  Negative seated straight leg raise.  Normal gait without ataxia. Normal speech and language. No gross focal neurologic deficits are appreciated. Skin:  Skin is warm, dry and intact. No rash noted. Psychiatric: Mood and affect are normal. Patient exhibits appropriate insight and judgment. ____________________________________________   RADIOLOGY Not indicated ____________________________________________  PROCEDURES  Toradol 30 mg IM Norflex 60 mg IM Procedures ____________________________________________  INITIAL IMPRESSION / ASSESSMENT AND PLAN / ED COURSE  Patient with ED evaluation of low back pain and left leg pain which is reportedly acute on chronic.  Patient without any preceding injury or trauma at this time.  Her exam is overall benign reassuring at this time without any acute neuromuscular deficit.  We deferred x-rays at this time as the patient had imaging of the low back done last year.  Patient is also in the process of being evaluated by her newly established PCP at [facility].  Prescriptions for ketorolac as well as Flexeril are provided.  Patient is also given prescriptions for Relafen and hydrocodone (#10) for acute pain.  Patient is given a work note for 2 days as requested.  She will follow-up as planned or return to the ED if necessary.  Makayla Booker was evaluated in Emergency Department on  09/16/2019 for the symptoms described in the history of present illness. She was evaluated in the context of the global COVID-19 pandemic, which necessitated consideration that the patient might be at risk for infection with the SARS-CoV-2 virus that causes COVID-19. Institutional protocols and algorithms that pertain to the evaluation of patients at risk for COVID-19 are in a state of rapid change based on information released by regulatory bodies including the CDC and federal and state organizations. These policies and algorithms were followed during the patient's care in the ED.  I reviewed the patient's prescription history over the last 12 months in the multi-state controlled substances database(s) that includes [place], [place], [place], [place], [place], [place], [place], [place], [place], [place], [place], [place], [place], and [place].  Results were notable for no current prescriptions noted. ____________________________________________  FINAL CLINICAL IMPRESSION(S) / ED DIAGNOSES  Final diagnoses:  Chronic midline low back pain without sciatica  Left leg pain      Bradly Sangiovanni, [place], PA-C 09/16/19 2350    11/14/19, MD 09/17/19 [id number]

## 2019-09-23 ENCOUNTER — Encounter: Payer: Self-pay | Admitting: Emergency Medicine

## 2019-09-23 ENCOUNTER — Other Ambulatory Visit: Payer: Self-pay

## 2019-09-23 ENCOUNTER — Emergency Department
Admission: EM | Admit: 2019-09-23 | Discharge: 2019-09-23 | Disposition: A | Discharge status: Home / Self Care | Payer: Self-pay | Attending: Emergency Medicine | Admitting: Emergency Medicine

## 2019-09-23 ENCOUNTER — Emergency Department: Payer: Self-pay

## 2019-09-23 DIAGNOSIS — R202 Paresthesia of skin: Secondary | ICD-10-CM | POA: Insufficient documentation

## 2019-09-23 DIAGNOSIS — Z79899 Other long term (current) drug therapy: Secondary | ICD-10-CM | POA: Insufficient documentation

## 2019-09-23 DIAGNOSIS — I10 Essential (primary) hypertension: Secondary | ICD-10-CM | POA: Insufficient documentation

## 2019-09-23 DIAGNOSIS — M79605 Pain in left leg: Secondary | ICD-10-CM | POA: Insufficient documentation

## 2019-09-23 MED ORDER — KETOROLAC TROMETHAMINE 30 MG/ML IJ SOLN: 30.0000 mg | Freq: Once | INTRAMUSCULAR | Status: DC

## 2019-09-23 MED ORDER — GABAPENTIN 100 MG PO CAPS: 100.0000 mg | ORAL_CAPSULE | Freq: Every day | ORAL | 0 refills | Status: DC

## 2019-09-23 MED ORDER — KETOROLAC TROMETHAMINE 30 MG/ML IJ SOLN
15.0000 mg | Freq: Once | INTRAMUSCULAR | Status: AC
  Administered 2019-09-23: 15 mg via INTRAMUSCULAR
  Filled 2019-09-23: qty 1

## 2019-09-23 NOTE — ED Triage Notes (Signed)
Pt reports left leg pain. Was seen last week for same. Reports no improvement with meds.

## 2019-09-23 NOTE — Discharge Instructions (Signed)
Keep your appointment with [facility] tomorrow for further evaluation and treatment of your back pain and paresthesias to your left leg.  Begin taking gabapentin as directed.  Take this bottle with you when you see your doctor at [facility] tomorrow.

## 2019-09-23 NOTE — ED Notes (Signed)
Pt states she was seen here last Wednesday with left leg pain, states her whole leg was hurting then and was given 2 injections for pain and rx hydrocodone, flexeril and Toradol, states the medications are not helping and now she is only having pain to the left thigh with numbness. States "my leg gave out on me at work the other day".

## 2019-09-23 NOTE — ED Provider Notes (Signed)
Makayla Booker   ____________________________________________   First MD Initiated Contact with Patient 09/23/19 [phone]     (approximate)  I have reviewed the triage vital signs and the nursing notes.   HISTORY  Chief Complaint Leg Pain    HPI Makayla Booker is a 39 y.o. female presents to the ED with complaint of left leg pain.  Patient was seen in the ED last week and received an injection along with a prescription for hydrocodone, Flexeril and Toradol.  Patient states that she has an appointment with a neurologist at [facility] tomorrow but that she cannot handle the pain for 1 more day.  Patient denies any recent injury.  She states that "my leg gave out on me at work".  Patient states that after her last visit the Toradol gave her relief and that she was able to walk without any difficulty.  She also is extremely anxious as she has been told that her left leg pain is due to a blood clot.  Patient is at a low risk for DVT and that she is not having injury to her lower extremity, traveled, she is not on birth control and is a non-smoker.  Really she rates her pain as a 10/10.     Past Medical History:  Diagnosis Date  . Hypertension   . Migraines     There are no problems to display for this patient.   History reviewed. No pertinent surgical history.  Prior to Admission medications   Medication Sig Start Date End Date Taking? Authorizing Provider  amLODipine (NORVASC) 10 MG tablet Take 1 tablet (10 mg total) by mouth daily. 04/28/19   Fisher, Roselyn Bering, PA-C  cyclobenzaprine (FLEXERIL) 5 MG tablet Take 1 tablet (5 mg total) by mouth 3 (three) times daily as needed. 09/16/19   Menshew, Charlesetta Ivory, PA-C  gabapentin (NEURONTIN) 100 MG capsule Take 1 capsule (100 mg total) by mouth daily. 09/23/19 09/22/20  Tommi Rumps, PA-C  ketorolac (TORADOL) 10 MG tablet Take 1 tablet (10 mg total) by mouth every 8 (eight) hours. 09/16/19    Menshew, Charlesetta Ivory, PA-C  nabumetone (RELAFEN) 750 MG tablet Take 1 tablet (750 mg total) by mouth 2 (two) times daily as needed for mild pain. 09/16/19 10/16/19  Menshew, Charlesetta Ivory, PA-C    Allergies Penicillins  Family History  Problem Relation Age of Onset  . Cancer Maternal Grandmother   . Diabetes Mother   . Hypertension Mother   . Heart disease Paternal Aunt     Social History Social History   Tobacco Use  . Smoking status: Never Smoker  . Smokeless tobacco: Never Used  Substance Use Topics  . Alcohol use: Not Currently  . Drug use: No    Review of Systems Constitutional: No fever/chills Cardiovascular: Denies chest pain. Respiratory: Denies shortness of breath. Gastrointestinal: No abdominal pain.  No nausea, no vomiting.   Musculoskeletal: Negative for back pain.  Positive for left thigh pain/numbness. Skin: Negative for rash. Neurological: Negative for headaches ____________________________________________   PHYSICAL EXAM:  VITAL SIGNS: ED Triage Vitals  Enc Vitals Group     BP 09/23/19 0838 (!) 156/101     Pulse Rate 09/23/19 0838 100     Resp 09/23/19 0838 20     Temp 09/23/19 0838 98.4 F (36.9 C)     Temp Source 09/23/19 0838 Oral     SpO2 09/23/19 0838 97 %  Weight 09/23/19 0836 279 lb 15.8 oz (127 kg)     Height 09/23/19 0836 5\' 8"  (1.727 m)     Head Circumference --      Peak Flow --      Pain Score 09/23/19 0836 10     Pain Loc --      Pain Edu? --      Excl. in GC? --     Constitutional: Alert and oriented. Well appearing and in no acute distress. Eyes: Conjunctivae are normal. PERRL. EOMI. Head: Atraumatic. Neck: No stridor.   Cardiovascular: Normal rate, regular rhythm. Grossly normal heart sounds.  Good peripheral circulation. Respiratory: Normal respiratory effort.  No retractions. Lungs CTAB. Gastrointestinal: Soft and nontender. No distention.  Musculoskeletal: No gross deformity is noted and no skin discoloration  or abrasions were noted to suggest a recent injury.  Patient is generalized tenderness anterior and posterior upper thigh, posterior knee and posterior tib-fib.  No pitting edema or discoloration is noted of the lower extremity.  Pulses are present.  Patient complains of pain with 09/25/19' sign but also complains of pain with any movement of her left lower extremity.  Unable to obtain straight leg raises as patient was unable to tolerate this. Neurologic:  Normal speech and language. No gross focal neurologic deficits are appreciated. Skin:  Skin is warm, dry and intact. No rash noted. Psychiatric: Mood and affect are normal. Speech and behavior are normal.  ____________________________________________   LABS (all labs ordered are listed, but only abnormal results are displayed)  Labs Reviewed - No data to display  RADIOLOGY   Official radiology report(s): Denna Haggard Venous Img Lower Unilateral Left  Result Date: 09/23/2019 CLINICAL DATA:  Two week history of lower extremity pain EXAM: LEFT LOWER EXTREMITY VENOUS DUPLEX ULTRASOUND TECHNIQUE: Gray-scale sonography with graded compression, as well as color Doppler and duplex ultrasound were performed to evaluate the left lower extremity deep venous system from the level of the common femoral vein and including the common femoral, femoral, profunda femoral, popliteal and calf veins including the posterior tibial, peroneal and gastrocnemius veins when visible. The superficial great saphenous vein was also interrogated. Spectral Doppler was utilized to evaluate flow at rest and with distal augmentation maneuvers in the common femoral, femoral and popliteal veins. COMPARISON:  None. FINDINGS: Contralateral Common Femoral Vein: Respiratory phasicity is normal and symmetric with the symptomatic side. No evidence of thrombus. Normal compressibility. Common Femoral Vein: No evidence of thrombus. Normal compressibility, respiratory phasicity and response to  augmentation. Saphenofemoral Junction: No evidence of thrombus. Normal compressibility and flow on color Doppler imaging. Profunda Femoral Vein: No evidence of thrombus. Normal compressibility and flow on color Doppler imaging. Femoral Vein: No evidence of thrombus. Normal compressibility, respiratory phasicity and response to augmentation. Popliteal Vein: No evidence of thrombus. Normal compressibility, respiratory phasicity and response to augmentation. Calf Veins: No evidence of thrombus. Normal compressibility and flow on color Doppler imaging. Superficial Great Saphenous Vein: No evidence of thrombus. Normal compressibility. Venous Reflux:  None. Other Findings:  None. IMPRESSION: No evidence of deep venous thrombosis in the left lower extremity. Right common femoral vein also patent. Electronically Signed   By: 09/25/2019 III M.D.   On: 09/23/2019 11:35    ____________________________________________   PROCEDURES  Procedure(s) performed (including Critical Care):  Procedures   ____________________________________________   INITIAL IMPRESSION / ASSESSMENT AND PLAN / ED COURSE  As part of my medical decision making, I reviewed the following data within the electronic MEDICAL RECORD NUMBER  Notes from prior ED visits and [place] Controlled Substance Database  39 year old female presents to the ED with complaint of left thigh numbness.  Patient states that she has an appointment at [facility] tomorrow with neurosurgery about her chronic back pain and she has had a history of sciatica in the past.  There is been no recent injury.  Patient is anxious that she has a DVT.  Ultrasound left lower extremity is negative for DVT and patient was reassured.  She will continue with her regular medication and a prescription for gabapentin was given to her to take 1 daily unless instructed by her neurosurgeon otherwise.  Patient was encouraged to use ice or heat to her lower back as needed for  discomfort.  ____________________________________________   FINAL CLINICAL IMPRESSION(S) / ED DIAGNOSES  Final diagnoses:  Left leg paresthesias     ED Discharge Orders         Ordered    gabapentin (NEURONTIN) 100 MG capsule  Daily     09/23/19 1224           Booker:  This document was prepared using Dragon voice recognition software and may include unintentional dictation errors.    Johnn Hai, PA-C 09/23/19 1539    Harvest Dark, MD 09/24/19 2237

## 2019-11-10 ENCOUNTER — Ambulatory Visit: Payer: Self-pay

## 2019-11-19 ENCOUNTER — Other Ambulatory Visit: Payer: Self-pay

## 2019-11-19 ENCOUNTER — Ambulatory Visit (LOCAL_COMMUNITY_HEALTH_CENTER): Payer: Self-pay

## 2019-11-19 VITALS — BP 118/79 | Ht 68.0 in | Wt 282.5 lb

## 2019-11-19 DIAGNOSIS — Z3009 Encounter for other general counseling and advice on contraception: Secondary | ICD-10-CM

## 2019-11-19 DIAGNOSIS — Z30013 Encounter for initial prescription of injectable contraceptive: Secondary | ICD-10-CM

## 2019-11-19 MED ORDER — MULTI-VITAMIN/MINERALS PO TABS: 1.0000 | ORAL_TABLET | Freq: Every day | ORAL | 0 refills | Status: DC

## 2019-11-19 NOTE — Progress Notes (Signed)
Depo administered without difficulty per 08/19/2019 written order of Sadie Haber PA and client tolerated without complaint. Jossie Ng, RN

## 2020-01-05 ENCOUNTER — Ambulatory Visit: Payer: Self-pay

## 2020-01-20 ENCOUNTER — Telehealth: Payer: Self-pay | Admitting: General Practice

## 2020-01-20 NOTE — Telephone Encounter (Signed)
LVM regarding [employer] denial letter.

## 2020-02-10 ENCOUNTER — Ambulatory Visit: Payer: Self-pay

## 2020-02-12 ENCOUNTER — Other Ambulatory Visit: Payer: Self-pay

## 2020-02-12 ENCOUNTER — Encounter: Payer: Self-pay | Admitting: Advanced Practice Midwife

## 2020-02-12 ENCOUNTER — Ambulatory Visit (LOCAL_COMMUNITY_HEALTH_CENTER): Payer: Self-pay | Admitting: Advanced Practice Midwife

## 2020-02-12 VITALS — BP 133/96 | Ht 68.0 in | Wt 280.4 lb

## 2020-02-12 DIAGNOSIS — Z3009 Encounter for other general counseling and advice on contraception: Secondary | ICD-10-CM

## 2020-02-12 DIAGNOSIS — Z30013 Encounter for initial prescription of injectable contraceptive: Secondary | ICD-10-CM

## 2020-02-12 DIAGNOSIS — Z9889 Other specified postprocedural states: Secondary | ICD-10-CM | POA: Insufficient documentation

## 2020-02-12 DIAGNOSIS — R030 Elevated blood-pressure reading, without diagnosis of hypertension: Secondary | ICD-10-CM

## 2020-02-12 DIAGNOSIS — R87619 Unspecified abnormal cytological findings in specimens from cervix uteri: Secondary | ICD-10-CM

## 2020-02-12 DIAGNOSIS — Z3042 Encounter for surveillance of injectable contraceptive: Secondary | ICD-10-CM

## 2020-02-12 DIAGNOSIS — E669 Obesity, unspecified: Secondary | ICD-10-CM

## 2020-02-12 MED ORDER — MULTIVITAMINS PO CAPS: 1.0000 | ORAL_CAPSULE | Freq: Every day | ORAL | 0 refills | Status: AC

## 2020-02-12 NOTE — Progress Notes (Signed)
   [facility] problem visit  [facility]  Subjective:  Makayla Booker is a 39 y.o. SBF G3P1 nonsmoker being seen today for DMPA and c/o heavy menses with vaginal pain and low abdominal cramps with last menses end of June  Chief Complaint  Patient presents with  . Contraception    Annual Physical and Depo    HPI LMP 01/31/20 heavy and "bad cramps" with vaginal pain.  Last DMPA 11/19/19; before that was 08/19/19 (restart); so today is her third DMPA.  Last sex yesterday without condom; with current partner x 10 years. Last PE 08/19/2019. Abnormal paps with LEEP x2 (05/06/2010 and 09/13/2016).  Does the patient have a current or past history of drug use? No   No components found for: HCV]   Health Maintenance Due  Topic Date Due  . Hepatitis C Screening  Never done  . COVID-19 Vaccine (1) Never done  . HIV Screening  Never done  . TETANUS/TDAP  Never done    Review of Systems  Neurological: Positive for headaches (c/o occassiional h/a).  All other systems reviewed and are negative.   The following portions of the patient's history were reviewed and updated as appropriate: allergies, current medications, past family history, past medical history, past social history, past surgical history and problem list. Problem list updated.   See flowsheet for other program required questions.  Objective:   Vitals:   02/12/20 1334  BP: (!) 133/96  Weight: 280 lb 6.4 oz (127.2 kg)  Height: 5\' 8"  (1.727 m)    Physical Exam Vitals and nursing note reviewed.  Constitutional:      Appearance: Normal appearance.  HENT:     Head: Normocephalic.  Eyes:     Conjunctiva/sclera: Conjunctivae normal.  Pulmonary:     Effort: Pulmonary effort is normal.  Abdominal:     Comments: Soft, poor tone, increased adipose, without tenderness  Genitourinary:    General: Normal vulva.     Exam position: Lithotomy position.     Vagina: Bleeding (small amt red menses  blood) present.     Cervix: Normal.     Uterus: Normal.      Adnexa: Right adnexa normal and left adnexa normal.  Musculoskeletal:        General: Normal range of motion.  Skin:    General: Skin is warm and dry.  Neurological:     Mental Status: She is alert. Mental status is at baseline.       Assessment and Plan:  [name] is a 39 y.o. female presenting to the [facility] Department for a Women's Health problem visit  1. Obesity, unspecified classification, unspecified obesity type, unspecified whether serious comorbidity present   2. Family planning Differential dx for vaginal pain/low abdominal cramping/heavy menses: fibroids/endometriosis Pt agrees expectant management with comfort measures and Ibuprofen at onset of menses; to discuss at next DMPA or to call if continues Today is third DMPA after restarting  3. Encounter for surveillance of injectable contraceptive May have DMPA 150 mg IM per 08/19/19 order  4. Elevated blood pressure reading 133/96 on 02/12/20 Repeat BP at next DMPA Pt counseled to go to primary care MD     No follow-ups on file.  No future appointments.  04/14/20, CNM

## 2020-02-12 NOTE — Progress Notes (Signed)
Here today for Depo. Last PE was 08/19/2019, last Pap Smear was 04/15/2019 (NIL) last Depo was 11/19/2019 (12.1 weeks.) 1 year Depo order given by provider C. [place], [place] 09/06/2019. Patient states since she restarted Depo "this time" she is concerned with heavy vaginal bleeding then changes to spotting and severe vaginal pain with bleeding. Would like to discuss with provider. Declines all STD screening today. Tawny Hopping, RN

## 2020-05-10 ENCOUNTER — Ambulatory Visit: Payer: Self-pay

## 2020-05-13 ENCOUNTER — Ambulatory Visit: Payer: Self-pay

## 2020-05-18 ENCOUNTER — Ambulatory Visit: Payer: Self-pay

## 2020-05-24 ENCOUNTER — Ambulatory Visit (LOCAL_COMMUNITY_HEALTH_CENTER): Payer: Self-pay

## 2020-05-24 ENCOUNTER — Other Ambulatory Visit: Payer: Self-pay

## 2020-05-24 VITALS — BP 142/98 | Ht 68.0 in | Wt 291.5 lb

## 2020-05-24 DIAGNOSIS — Z30013 Encounter for initial prescription of injectable contraceptive: Secondary | ICD-10-CM

## 2020-05-24 DIAGNOSIS — Z3009 Encounter for other general counseling and advice on contraception: Secondary | ICD-10-CM

## 2020-05-24 DIAGNOSIS — Z3042 Encounter for surveillance of injectable contraceptive: Secondary | ICD-10-CM

## 2020-05-24 NOTE — Progress Notes (Signed)
Pt is 14.4 weeks post depo today. Pt denies headaches, blurred vision, dizziness. Pt states the last time she took her BP medication was yesterday morning as she headed out for work, has supply of medicine, is tired and ready to go home.  Consulted with provider regarding BP. Per verbal order by Sadie Haber, PA administered DMPA 150 mg IM today and counseled pt to take BP medications as directed and follow-up with the provider that follows her for BP issues.  (Ref also Sadie Haber, [place] order dated 08/19/19 for 1 year of depo.)

## 2020-08-03 ENCOUNTER — Emergency Department
Admission: EM | Admit: 2020-08-03 | Discharge: 2020-08-03 | Disposition: A | Discharge status: Home / Self Care | Payer: Self-pay | Attending: Emergency Medicine | Admitting: Emergency Medicine

## 2020-08-03 ENCOUNTER — Emergency Department: Payer: Self-pay

## 2020-08-03 ENCOUNTER — Other Ambulatory Visit: Payer: Self-pay

## 2020-08-03 DIAGNOSIS — I10 Essential (primary) hypertension: Secondary | ICD-10-CM | POA: Insufficient documentation

## 2020-08-03 DIAGNOSIS — Z79899 Other long term (current) drug therapy: Secondary | ICD-10-CM | POA: Insufficient documentation

## 2020-08-03 DIAGNOSIS — R202 Paresthesia of skin: Secondary | ICD-10-CM | POA: Insufficient documentation

## 2020-08-03 DIAGNOSIS — R0789 Other chest pain: Secondary | ICD-10-CM | POA: Insufficient documentation

## 2020-08-03 LAB — BASIC METABOLIC PANEL
Anion gap: 9 (ref 5–15)
BUN: 24 mg/dL — ABNORMAL HIGH (ref 6–20)
CO2: 25 mmol/L (ref 22–32)
Calcium: 8.9 mg/dL (ref 8.9–10.3)
Chloride: 105 mmol/L (ref 98–111)
Creatinine, Ser: 0.95 mg/dL (ref 0.44–1.00)
GFR, Estimated: >60 mL/min (ref >60)
Glucose, Bld: 111 mg/dL — ABNORMAL HIGH (ref 70–99)
Potassium: 3.6 mmol/L (ref 3.5–5.1)
Sodium: 139 mmol/L (ref 135–145)

## 2020-08-03 LAB — CBC
HCT: 37.8 % (ref 36.0–46.0)
Hemoglobin: 12.5 g/dL (ref 12.0–15.0)
MCH: 32.7 pg (ref 26.0–34.0)
MCHC: 33.1 g/dL (ref 30.0–36.0)
MCV: 99 fL (ref 80.0–100.0)
Platelets: 317 10*3/uL (ref 150–400)
RBC: 3.82 MIL/uL — ABNORMAL LOW (ref 3.87–5.11)
RDW: 13.2 % (ref 11.5–15.5)
WBC: 7.3 10*3/uL (ref 4.0–10.5)
nRBC: 0 % (ref 0.0–0.2)

## 2020-08-03 LAB — TROPONIN I (HIGH SENSITIVITY): Troponin I (High Sensitivity): 3 ng/L (ref <18)

## 2020-08-03 MED ORDER — LIDOCAINE VISCOUS HCL 2 % MT SOLN
15.0000 mL | Freq: Once | OROMUCOSAL | Status: AC
  Administered 2020-08-03: 13:00:00 15 mL via ORAL
  Filled 2020-08-03: qty 15

## 2020-08-03 MED ORDER — PANTOPRAZOLE SODIUM 20 MG PO TBEC: 20.0000 mg | DELAYED_RELEASE_TABLET | Freq: Every day | ORAL | 1 refills | Status: DC

## 2020-08-03 MED ORDER — ALUM & MAG HYDROXIDE-SIMETH 200-200-20 MG/5ML PO SUSP
30.0000 mL | Freq: Once | ORAL | Status: AC
  Administered 2020-08-03: 13:00:00 30 mL via ORAL
  Filled 2020-08-03: qty 30

## 2020-08-03 NOTE — ED Provider Notes (Signed)
[facility] Emergency Department Provider Note   ____________________________________________    I have reviewed the triage vital signs and the nursing notes.   HISTORY  Chief Complaint Chest Pain     HPI Makayla Booker is a 39 y.o. female who presents with complaints of chest discomfort.  Patient describes a burning/pressure type discomfort to the central chest which is been intermittent over the last week, does seem to be worse at night.  Did have some tingling in her left arm as well which concerned her and prompted her to seek medical attention.  Denies fevers chills or cough.  No pleurisy.  No sick contacts reported, no muscle weakness or neuro deficits   Past Medical History:  Diagnosis Date   Hypertension    Migraines     Patient Active Problem List   Diagnosis Date Noted   Obesity BMI=42.6 02/12/2020   Elevated blood pressure reading 133/96 on 02/12/20 02/12/2020   Abnormal Pap smear of cervix 02/12/2020   H/O LEEP x2-- 05/06/2010 and 09/13/2016 02/12/2020    History reviewed. No pertinent surgical history.  Prior to Admission medications   Medication Sig Start Date End Date Taking? Authorizing Provider  amLODipine (NORVASC) 10 MG tablet Take 1 tablet (10 mg total) by mouth daily. 04/28/19   Fisher, Roselyn Bering, PA-C  cyclobenzaprine (FLEXERIL) 5 MG tablet Take 1 tablet (5 mg total) by mouth 3 (three) times daily as needed. Patient not taking: Reported on 11/19/2019 09/16/19   Menshew, Charlesetta Ivory, PA-C  gabapentin (NEURONTIN) 100 MG capsule Take 1 capsule (100 mg total) by mouth daily. Patient not taking: Reported on 11/19/2019 09/23/19 09/22/20  Tommi Rumps, PA-C  ketorolac (TORADOL) 10 MG tablet Take 1 tablet (10 mg total) by mouth every 8 (eight) hours. Patient not taking: Reported on 11/19/2019 09/16/19   Menshew, Charlesetta Ivory, PA-C  Multiple Vitamins-Minerals (MULTIVITAMIN WITH MINERALS) tablet Take 1 tablet by mouth daily.  11/19/19   Federico Flake, MD  pantoprazole (PROTONIX) 20 MG tablet Take 1 tablet (20 mg total) by mouth daily. 08/03/20 08/03/21  Jene Every, MD     Allergies Penicillins  Family History  Problem Relation Age of Onset   Cancer Maternal Grandmother    Diabetes Mother    Hypertension Mother    Heart disease Paternal Aunt     Social History Social History   Tobacco Use   Smoking status: Never Smoker   Smokeless tobacco: Never Used  Building services engineer Use: Never used  Substance Use Topics   Alcohol use: Not Currently   Drug use: No    Review of Systems  Constitutional: No fever/chills Eyes: No visual changes.  ENT: No sore throat. Cardiovascular: As above Respiratory: Denies shortness of breath. Gastrointestinal: No abdominal pain.  No nausea, no vomiting.   Genitourinary: Negative for dysuria. Musculoskeletal: Negative for back pain. Skin: Negative for rash. Neurological: Negative for headaches    ____________________________________________   PHYSICAL EXAM:  VITAL SIGNS: ED Triage Vitals  Enc Vitals Group     BP 08/03/20 1116 135/88     Pulse Rate 08/03/20 1116 83     Resp 08/03/20 1116 18     Temp 08/03/20 1116 98.4 F (36.9 C)     Temp Source 08/03/20 1116 Oral     SpO2 08/03/20 1116 98 %     Weight 08/03/20 1114 131.5 kg (290 lb)     Height 08/03/20 1114 1.727 m (5\' 8" )  Head Circumference --      Peak Flow --      Pain Score 08/03/20 1113 10     Pain Loc --      Pain Edu? --      Excl. in GC? --     Constitutional: Alert and oriented. No acute distress. Pleasant and interactive Eyes: Conjunctivae are normal.  PERRLA  Mouth/Throat: Mucous membranes are moist.   Neck:  Painless ROM Cardiovascular: Normal rate, regular rhythm. Grossly normal heart sounds.  Good peripheral circulation. Respiratory: Normal respiratory effort.  No retractions. Lungs CTAB. Gastrointestinal: Soft and nontender. No distention.  No CVA  tenderness.  Musculoskeletal: No lower extremity tenderness nor edema.  Warm and well perfused Neurologic:  Normal speech and language. No gross focal neurologic deficits are appreciated.  Normal strength in all extremities, cranial nerves II to XII are normal Skin:  Skin is warm, dry and intact. No rash noted. Psychiatric: Mood and affect are normal. Speech and behavior are normal.  ____________________________________________   LABS (all labs ordered are listed, but only abnormal results are displayed)  Labs Reviewed  BASIC METABOLIC PANEL - Abnormal; Notable for the following components:      Result Value   Glucose, Bld 111 (*)    BUN 24 (*)    All other components within normal limits  CBC - Abnormal; Notable for the following components:   RBC 3.82 (*)    All other components within normal limits  POC URINE PREG, ED  TROPONIN I (HIGH SENSITIVITY)  TROPONIN I (HIGH SENSITIVITY)   ____________________________________________  EKG  ED ECG REPORT I, Jene Every, the attending physician, personally viewed and interpreted this ECG.  Date: 08/03/2020  Rhythm: normal sinus rhythm QRS Axis: normal Intervals: normal ST/T Wave abnormalities: normal Narrative Interpretation: no evidence of acute ischemia  ____________________________________________  RADIOLOGY  Chest x-ray reviewed by me, no abnormalities ____________________________________________   PROCEDURES  Procedure(s) performed: No  Procedures   Critical Care performed: No ____________________________________________   INITIAL IMPRESSION / ASSESSMENT AND PLAN / ED COURSE  Pertinent labs & imaging results that were available during my care of the patient were reviewed by me and considered in my medical decision making (see chart for details).  Patient presents with chest comfort as described above, reassuring EKG, normal high-sensitivity troponin.  Not consistent with ACS peritonitis or myocarditis.   No pleurisy to suggest PE.  Suspect GERD versus esophagitis  Chest x-ray is reassuring, no pneumonia.  Treated with GI cocktail with minimal improvement, will start Protonix and have the patient follow-up with cardiology for further evaluation, strict return cautions discussed    ____________________________________________   FINAL CLINICAL IMPRESSION(S) / ED DIAGNOSES  Final diagnoses:  Atypical chest pain        Note:  This document was prepared using Dragon voice recognition software and may include unintentional dictation errors.   Jene Every, MD 08/03/20 [phone]

## 2020-08-03 NOTE — ED Triage Notes (Signed)
Pt c/o substernal chest pain with left arm numbness intermittently for the past week states since last night that pain has been constant. Denies SOB or diaporesis

## 2020-08-11 ENCOUNTER — Ambulatory Visit (LOCAL_COMMUNITY_HEALTH_CENTER): Payer: Self-pay

## 2020-08-11 ENCOUNTER — Other Ambulatory Visit: Payer: Self-pay

## 2020-08-11 VITALS — BP 136/83 | Ht 68.0 in | Wt 287.0 lb

## 2020-08-11 DIAGNOSIS — Z3042 Encounter for surveillance of injectable contraceptive: Secondary | ICD-10-CM

## 2020-08-11 DIAGNOSIS — Z3009 Encounter for other general counseling and advice on contraception: Secondary | ICD-10-CM

## 2020-08-11 MED ORDER — MEDROXYPROGESTERONE ACETATE 150 MG/ML IM SUSP
150.0000 mg | Freq: Once | INTRAMUSCULAR | Status: AC
  Administered 2020-08-11: 150 mg via INTRAMUSCULAR

## 2020-08-11 NOTE — Progress Notes (Signed)
Consulted by RN re: patient with concern about heavy bleeding and vaginal pain related to Depo.  Reviewed RN note and agree that it reflects my recommendations.  Also, rec that RN tell patient the when she comes for RP we will reevaluate her situation and may refer for further testing if needed.

## 2020-08-11 NOTE — Progress Notes (Signed)
11 weeks 2 days post depo. Reports heavy bleeding and increased vaginal pain x 2 days on 07/29/2020. States same heavy bleeding and pain occurred for 2 days after last depo 05/24/2020. Consult C. [place], [place] who gives ok to proceed with depo today based on her order dated 08/19/2019. Advises for RN to counsel pt to take ibuprofen 800mg  by mouth every 8 hrs for the 2 days she experiences the bleeding and pain. RN carried out provider orders. Depo given Left deltoid IM per C.[place], STAVANGER order. Tolerated well. Pt in agreement with provider recommendations.  Next depo and physical due 10/27/2020, pt aware. 10/29/2020, RN

## 2020-08-19 ENCOUNTER — Other Ambulatory Visit: Payer: Self-pay

## 2020-08-19 DIAGNOSIS — Z20822 Contact with and (suspected) exposure to covid-19: Secondary | ICD-10-CM

## 2020-08-23 LAB — NOVEL CORONAVIRUS, NAA: SARS-CoV-2, NAA: DETECTED — AB

## 2020-08-25 ENCOUNTER — Telehealth: Payer: Self-pay | Admitting: Licensed Clinical Social Worker

## 2020-08-25 NOTE — Telephone Encounter (Signed)
Patient left vm requesting an appointment with LCSW.

## 2020-09-08 ENCOUNTER — Ambulatory Visit: Payer: Self-pay | Admitting: Licensed Clinical Social Worker

## 2020-11-07 ENCOUNTER — Ambulatory Visit: Payer: Self-pay

## 2020-11-09 ENCOUNTER — Other Ambulatory Visit: Payer: Self-pay

## 2020-11-09 ENCOUNTER — Encounter: Payer: Self-pay | Admitting: Advanced Practice Midwife

## 2020-11-09 ENCOUNTER — Ambulatory Visit (LOCAL_COMMUNITY_HEALTH_CENTER): Payer: Self-pay | Admitting: Advanced Practice Midwife

## 2020-11-09 VITALS — BP 124/84 | HR 87 | Ht 68.0 in | Wt 277.0 lb

## 2020-11-09 DIAGNOSIS — Z3042 Encounter for surveillance of injectable contraceptive: Secondary | ICD-10-CM

## 2020-11-09 DIAGNOSIS — F32A Depression, unspecified: Secondary | ICD-10-CM | POA: Insufficient documentation

## 2020-11-09 DIAGNOSIS — Z3009 Encounter for other general counseling and advice on contraception: Secondary | ICD-10-CM

## 2020-11-09 DIAGNOSIS — G43909 Migraine, unspecified, not intractable, without status migrainosus: Secondary | ICD-10-CM | POA: Insufficient documentation

## 2020-11-09 DIAGNOSIS — R030 Elevated blood-pressure reading, without diagnosis of hypertension: Secondary | ICD-10-CM

## 2020-11-09 LAB — WET PREP FOR TRICH, YEAST, CLUE
Trichomonas Exam: NEGATIVE
Yeast Exam: NEGATIVE

## 2020-11-09 MED ORDER — MEDROXYPROGESTERONE ACETATE 150 MG/ML IM SUSP
150.0000 mg | INTRAMUSCULAR | Status: AC
  Administered 2020-11-09 – 2021-10-10 (×4): 150 mg via INTRAMUSCULAR

## 2020-11-09 MED ORDER — METRONIDAZOLE 500 MG PO TABS
500.0000 mg | ORAL_TABLET | Freq: Two times a day (BID) | ORAL | 0 refills | Status: DC
  Filled 2020-11-09: qty 14, 7d supply, fill #0

## 2020-11-09 MED ORDER — METRONIDAZOLE 500 MG PO TABS: 500.0000 mg | ORAL_TABLET | Freq: Two times a day (BID) | ORAL | 0 refills | Status: DC

## 2020-11-09 NOTE — Progress Notes (Signed)
[facility] [address] Main Number: [phone]    Family Planning Visit- Initial Visit  Subjective:  Makayla Booker is a 40 y.o. SBF G3P1201 (31 yo son)   being seen today for an initial annual visit and to discuss contraceptive options.  The patient is currently using Depo Provera for pregnancy prevention. Patient reports she does not want a pregnancy in the next year.  Patient has the following medical conditions has Obesity BMI=42.6; Elevated blood pressure reading 133/96 on 02/12/20/HTN; Abnormal Pap smear of cervix; and H/O LEEP x2-- 05/06/2010 and 09/13/2016 on their problem list.  Chief Complaint  Patient presents with  . Annual Exam  . Contraception    Desires to continue Depo    Patient reports here for physical.  C/o heavy menses/bleeding on DMPA and vaginal pain.  Has had DMPA x 5 (08/19/19, 11/19/19, 02/12/20, 05/24/20, 08/11/20). Pt states no menses but last spotted 2 wks ago.  Has appt 11/14/20 at [facility] for above c/o because she wants a hysterectomy.  Last sex 10/26/20 without condom; with current partner x 10 years. HTN diagnosed and on med but has no primary care MD.  Last cig age 53. Last ETOH 10/26/20 (1 glass wine). Hx abnormal paps with LEEP x2 (05/06/10 and 09/13/16). Last pap 04/15/2019 neg HPV neg.  Employed 25 hours/wk and living alone.  Patient denies vaping, cigars, MJ  Body mass index is 42.12 kg/m. - Patient is eligible for diabetes screening based on BMI and age >67?  yes HA1C ordered? no  Patient reports 1  partner/s in last year. Desires STI screening?  Yes  Has patient been screened once for HCV in the past?  No  No results found for: HCVAB  Does the patient have current drug use (including MJ), have a partner with drug use, and/or has been incarcerated since last result? No  If yes-- Screen for HCV through [facility]   Does the patient meet criteria for HBV testing? No  Criteria:  -Household, sexual  or needle sharing contact with HBV -History of drug use -HIV positive -Those with known Hep C   Health Maintenance Due  Topic Date Due  . Hepatitis C Screening  Never done  . COVID-19 Vaccine (1) Never done  . HIV Screening  Never done  . TETANUS/TDAP  Never done    Review of Systems  All other systems reviewed and are negative.   The following portions of the patient's history were reviewed and updated as appropriate: allergies, current medications, past family history, past medical history, past social history, past surgical history and problem list. Problem list updated.   See flowsheet for other program required questions.  Objective:   Vitals:   11/09/20 1325  BP: 124/84  Pulse: 87  Weight: 277 lb (125.6 kg)  Height: 5\' 8"  (1.727 m)    Physical Exam Constitutional:      Appearance: Normal appearance. She is obese.  HENT:     Head: Normocephalic and atraumatic.     Comments: Thyroid without masses or enlargement Negative cervical lymphadenopathy    Mouth/Throat:     Mouth: Mucous membranes are moist.     Comments: No visible caries; can't remember last dental exam Eyes:     Conjunctiva/sclera: Conjunctivae normal.  Cardiovascular:     Rate and Rhythm: Normal rate and regular rhythm.  Pulmonary:     Effort: Pulmonary effort is normal.     Breath sounds: Normal breath  sounds.  Chest:  Breasts:     Right: Normal.     Left: Normal.    Abdominal:     Palpations: Abdomen is soft.     Comments: Poor tone, soft without masses or enlargement, increased adipose  Genitourinary:    General: Normal vulva.     Exam position: Lithotomy position.     Vagina: Vaginal discharge (milky pink leukorrhea with malodor, ph>4.5) present.     Cervix: Normal.     Rectum: Normal.     Comments: Difficult to assess uterus and ovaries due to increased adipose Musculoskeletal:        General: Normal range of motion.     Cervical back: Normal range of motion and neck supple.   Skin:    General: Skin is warm and dry.  Neurological:     Mental Status: She is alert.  Psychiatric:        Mood and Affect: Mood normal.       Assessment and Plan:  Makayla Booker is a 40 y.o. female presenting to the [facility] Department for an initial annual wellness/contraceptive visit  Contraception counseling: Reviewed all forms of birth control options in the tiered based approach. available including abstinence; over the counter/barrier methods; hormonal contraceptive medication including pill, patch, ring, injection,contraceptive implant, ECP; hormonal and nonhormonal IUDs; permanent sterilization options including vasectomy and the various tubal sterilization modalities. Risks, benefits, and typical effectiveness rates were reviewed.  Questions were answered.  Written information was also given to the patient to review.  Patient desires DMPA, this was prescribed for patient. She will follow up in  11-13 wks for surveillance.  She was told to call with any further questions, or with any concerns about this method of contraception.  Emphasized use of condoms 100% of the time for STI prevention.  Patient was offered ECP.Marland Kitchen ECP was not accepted by the patient. ECP counseling was not given - see RN documentation  1. Family planning Treat wet mount per standing orders Immunization nurse consult Please give pt contact info for Kathreen Cosier, LCSW - WET PREP FOR TRICH, YEAST, CLUE - Chlamydia/Gonorrhea [facility] - medroxyPROGESTERone (DEPO-PROVERA) injection 150 mg  2. Encounter for surveillance of injectable contraceptive May have DMPA 150 mg IM q 11-13 wks x 1 year  3. Elevated blood pressure reading 133/96 on 02/12/20; 124/84 today Pt states she doesn't have a primary care MD Please give pt primary care MD list and [facility] brochure     No follow-ups on file.  No future appointments.  Alberteen Spindle, CNM

## 2020-11-09 NOTE — Progress Notes (Signed)
States has appt at [facility] on 11/14/2020 to discuss hysterectomy and vaginal pain. Jossie Ng, RN  Wet mount reviewed and treated for BV with Metronidazole in standing order per E. Sciora CNM.  Client tolerated Depo without complaint. Jossie Ng, RN

## 2020-11-16 LAB — HM HIV SCREENING LAB: HM HIV Screening: NEGATIVE

## 2020-12-24 ENCOUNTER — Emergency Department: Payer: Self-pay

## 2020-12-24 ENCOUNTER — Emergency Department
Admission: EM | Admit: 2020-12-24 | Discharge: 2020-12-24 | Disposition: A | Discharge status: Home / Self Care | Payer: Self-pay | Attending: Emergency Medicine | Admitting: Emergency Medicine

## 2020-12-24 ENCOUNTER — Other Ambulatory Visit: Payer: Self-pay

## 2020-12-24 DIAGNOSIS — W010XXA Fall on same level from slipping, tripping and stumbling without subsequent striking against object, initial encounter: Secondary | ICD-10-CM | POA: Insufficient documentation

## 2020-12-24 DIAGNOSIS — I10 Essential (primary) hypertension: Secondary | ICD-10-CM | POA: Insufficient documentation

## 2020-12-24 DIAGNOSIS — Y92000 Kitchen of unspecified non-institutional (private) residence as  the place of occurrence of the external cause: Secondary | ICD-10-CM | POA: Insufficient documentation

## 2020-12-24 DIAGNOSIS — S40012A Contusion of left shoulder, initial encounter: Secondary | ICD-10-CM | POA: Insufficient documentation

## 2020-12-24 DIAGNOSIS — Z79899 Other long term (current) drug therapy: Secondary | ICD-10-CM | POA: Insufficient documentation

## 2020-12-24 MED ORDER — OXYCODONE-ACETAMINOPHEN 5-325 MG PO TABS
1.0000 | ORAL_TABLET | Freq: Once | ORAL | Status: AC
  Administered 2020-12-24: 1 via ORAL
  Filled 2020-12-24: qty 1

## 2020-12-24 MED ORDER — OXYCODONE-ACETAMINOPHEN 5-325 MG PO TABS: 1.0000 | ORAL_TABLET | Freq: Four times a day (QID) | ORAL | 0 refills | Status: DC | PRN

## 2020-12-24 NOTE — Discharge Instructions (Signed)
Follow-up with Dr. Martha Clan if any continued problems or not improving with your left shoulder injury.  A prescription for oxycodone was sent to the pharmacy to be taken every 6 hours as needed for pain.  You may also take ibuprofen or naproxen with this medication for inflammation which will reduce your pain.  Ice and elevation.  Wear sling for added support and protection.  You will need to be seen at an urgent care for reevaluation if you are unable to use your shoulder without increased pain before returning to work.

## 2020-12-24 NOTE — ED Provider Notes (Signed)
[facility] Emergency Department Provider Note  ____________________________________________   Event Date/Time   First MD Initiated Contact with Patient 12/24/20 1201     (approximate)  I have reviewed the triage vital signs and the nursing notes.   HISTORY  Chief Complaint Shoulder Pain and Arm Pain   HPI Makayla Booker is a 40 y.o. female resents to the ED with complaint of left shoulder pain.  Patient states that she fell in her kitchen 2 days ago.  She denies any head injury or loss of consciousness during this fall.  She states that she landed on her left arm.  She has had increased pain with range of motion.  She is taken Tylenol every 6 hours with minimal relief but states that a heating pad seems to help.  She denies any previous injury to her shoulder.       Past Medical History:  Diagnosis Date  . Depression    Per client report.  Marland Kitchen History of abnormal cervical Pap smear   . History of anemia   . History of urinary tract infection   . Hypertension   . Hypertension   . Migraines     Patient Active Problem List   Diagnosis Date Noted  . Migraines dx'd age 59 11/09/2020  . Depression 11/09/2020  . Obesity BMI=42.6 02/12/2020  . Elevated blood pressure reading 133/96 on 02/12/20/HTN 02/12/2020  . Abnormal Pap smear of cervix 02/12/2020  . H/O LEEP x2-- 05/06/2010 and 09/13/2016 02/12/2020    No past surgical history on file.  Prior to Admission medications   Medication Sig Start Date End Date Taking? Authorizing Provider  oxyCODONE-acetaminophen (PERCOCET) 5-325 MG tablet Take 1 tablet by mouth every 6 (six) hours as needed for severe pain. 12/24/20 12/24/21 Yes Yerik Zeringue L, PA-C  amLODipine (NORVASC) 10 MG tablet Take 1 tablet (10 mg total) by mouth daily. 04/28/19   Fisher, Roselyn Bering, PA-C  gabapentin (NEURONTIN) 100 MG capsule Take 1 capsule (100 mg total) by mouth daily. Patient not taking: Reported on 11/19/2019 09/23/19 12/24/20   Tommi Rumps, PA-C  pantoprazole (PROTONIX) 20 MG tablet Take 1 tablet (20 mg total) by mouth daily. Patient not taking: Reported on 11/09/2020 08/03/20 12/24/20  Jene Every, MD    Allergies Penicillins  Family History  Problem Relation Age of Onset  . Cancer Maternal Grandmother   . Diabetes Mother   . Hypertension Mother   . Asthma Mother   . Heart disease Paternal Aunt   . Diabetes Paternal Aunt   . Diabetes Father   . Hypertension Sister   . Thyroid disease Sister   . Lupus Sister   . Healthy Son     Social History Social History   Tobacco Use  . Smoking status: Never Smoker  . Smokeless tobacco: Never Used  Vaping Use  . Vaping Use: Never used  Substance Use Topics  . Alcohol use: Yes    Comment: Last ETOH use 10/2020.  . Drug use: Never    Review of Systems Constitutional: No fever/chills Eyes: No visual changes. ENT: No trauma. Cardiovascular: Denies chest pain. Respiratory: Denies shortness of breath. Gastrointestinal: No abdominal pain.  No nausea, no vomiting. Musculoskeletal: Positive for left shoulder pain. Skin: Negative for abrasion. Neurological: Negative for headaches, focal weakness or numbness. ____________________________________________   PHYSICAL EXAM:  VITAL SIGNS: ED Triage Vitals  Enc Vitals Group     BP      Pulse  Resp      Temp      Temp src      SpO2      Weight      Height      Head Circumference      Peak Flow      Pain Score      Pain Loc      Pain Edu?      Excl. in GC?     Constitutional: Alert and oriented. Well appearing and in no acute distress. Eyes: Conjunctivae are normal. PERRL. EOMI. Head: Atraumatic. Neck: No stridor.  Nontender cervical spine to palpation posteriorly. Cardiovascular: Normal rate, regular rhythm. Grossly normal heart sounds.  Good peripheral circulation. Respiratory: Normal respiratory effort.  No retractions. Lungs CTAB. Gastrointestinal: Soft and nontender. No CVA  tenderness. Musculoskeletal: On examination of the left shoulder there is no gross deformity however range of motion is restricted secondary to patient's discomfort.  No step-offs or gross deformity is noted.  No ecchymosis or abrasions are seen.  Patient is able to move both elbow and wrist without restriction.  Radial pulses present.  Skin is intact.  No tenderness or edema is noted to the elbow or wrist. Neurologic:  Normal speech and language. No gross focal neurologic deficits are appreciated.  Skin:  Skin is warm, dry and intact. No rash noted. Psychiatric: Mood and affect are normal. Speech and behavior are normal.  ____________________________________________   LABS (all labs ordered are listed, but only abnormal results are displayed)  Labs Reviewed - No data to display ____________________________________________   RADIOLOGY I, Tommi Rumps, personally viewed and evaluated these images (plain radiographs) as part of my medical decision making, as well as reviewing the written report by the radiologist.   Official radiology report(s): DG Shoulder Left  Result Date: 12/24/2020 CLINICAL DATA:  Fall 2 days ago.  Unable to lift arm. EXAM: LEFT SHOULDER - 2+ VIEW COMPARISON:  None. FINDINGS: There is no evidence of fracture or dislocation. There is no evidence of arthropathy or other focal bone abnormality. Soft tissues are unremarkable. IMPRESSION: Negative left shoulder radiographs. Electronically Signed   By: Marin Roberts M.D.   On: 12/24/2020 13:00    ____________________________________________   PROCEDURES  Procedure(s) performed (including Critical Care):  Procedures   ____________________________________________   INITIAL IMPRESSION / ASSESSMENT AND PLAN / ED COURSE  As part of my medical decision making, I reviewed the following data within the electronic MEDICAL RECORD NUMBER Notes from prior ED visits and [place] Controlled Substance Database  40 year old  female presents to the ED with complaint of left shoulder pain after she fell 2 days ago.  Patient states that she landed on the floor but denies any head injury or loss of consciousness.  She has been unable to lift her left arm due to pain.  Patient has been taking Tylenol over-the-counter without any relief.  While in the emergency department she was given oxycodone-acetaminophen.  X-ray was negative for acute bony injury.  Patient was made aware.  She is to wear a sling for support and protection.  A note was written for her to remain out of work for 2 days.  She is to be reevaluated before going to work if she is still unable to use her left arm.  She was given intact information for Dr. Martha Clan who is the orthopedist on-call.  Ice and elevation as needed for pain and a prescription for Percocet was sent to her pharmacy.  She is encouraged  also to take an anti-inflammatory for inflammation and to help also decrease pain.  ____________________________________________   FINAL CLINICAL IMPRESSION(S) / ED DIAGNOSES  Final diagnoses:  Contusion of left shoulder, initial encounter     ED Discharge Orders         Ordered    oxyCODONE-acetaminophen (PERCOCET) 5-325 MG tablet  Every 6 hours PRN        12/24/20 1341          *Please note:  Makayla Booker was evaluated in Emergency Department on 12/24/2020 for the symptoms described in the history of present illness. She was evaluated in the context of the global COVID-19 pandemic, which necessitated consideration that the patient might be at risk for infection with the SARS-CoV-2 virus that causes COVID-19. Institutional protocols and algorithms that pertain to the evaluation of patients at risk for COVID-19 are in a state of rapid change based on information released by regulatory bodies including the CDC and federal and state organizations. These policies and algorithms were followed during the patient's care in the ED.  Some ED evaluations and  interventions may be delayed as a result of limited staffing during and the pandemic.*   Note:  This document was prepared using Dragon voice recognition software and may include unintentional dictation errors.    Tommi Rumps, PA-C 12/24/20 1353    Delton Prairie, MD 12/24/20 [phone]

## 2020-12-24 NOTE — ED Triage Notes (Signed)
Pt via POV from home. Pt fell on her L arm on Thursday night. Denies head injury. Pt states she is unable to lift her L arm. Pt is A&Ox4 and NAD. Ambulatory to triage

## 2021-01-30 ENCOUNTER — Ambulatory Visit: Payer: Self-pay

## 2021-02-01 ENCOUNTER — Other Ambulatory Visit: Payer: Self-pay

## 2021-02-01 ENCOUNTER — Ambulatory Visit (LOCAL_COMMUNITY_HEALTH_CENTER): Payer: Self-pay

## 2021-02-01 VITALS — BP 128/90 | Ht 68.0 in | Wt 277.0 lb

## 2021-02-01 DIAGNOSIS — Z3009 Encounter for other general counseling and advice on contraception: Secondary | ICD-10-CM

## 2021-02-01 DIAGNOSIS — Z3042 Encounter for surveillance of injectable contraceptive: Secondary | ICD-10-CM

## 2021-02-01 NOTE — Addendum Note (Signed)
Addended by: Tracey Harries on: 02/01/2021 04:12 PM   Modules accepted: Level of Service

## 2021-02-01 NOTE — Progress Notes (Signed)
Pt is 12.0 weeks post depo today. DMPA 150 mg IM administered per Arnetha Courser, CNM order dated 11/09/20.

## 2021-05-03 ENCOUNTER — Ambulatory Visit: Payer: Self-pay

## 2021-07-04 ENCOUNTER — Ambulatory Visit: Payer: Self-pay

## 2021-07-11 ENCOUNTER — Ambulatory Visit: Payer: Self-pay

## 2021-07-18 ENCOUNTER — Telehealth: Payer: Self-pay | Admitting: Licensed Clinical Social Worker

## 2021-07-18 ENCOUNTER — Ambulatory Visit (LOCAL_COMMUNITY_HEALTH_CENTER): Payer: Self-pay | Admitting: Family Medicine

## 2021-07-18 ENCOUNTER — Other Ambulatory Visit: Payer: Self-pay

## 2021-07-18 VITALS — BP 106/71 | Ht 68.0 in | Wt 285.6 lb

## 2021-07-18 DIAGNOSIS — Z7251 High risk heterosexual behavior: Secondary | ICD-10-CM

## 2021-07-18 DIAGNOSIS — Z30013 Encounter for initial prescription of injectable contraceptive: Secondary | ICD-10-CM

## 2021-07-18 DIAGNOSIS — Z3009 Encounter for other general counseling and advice on contraception: Secondary | ICD-10-CM

## 2021-07-18 LAB — PREGNANCY, URINE: Preg Test, Ur: NEGATIVE

## 2021-07-18 NOTE — Progress Notes (Signed)
PT negative, provider aware and ok to give Depo. Depo given, left deltoid, tolerated well and next Depo card given. Patient counseled on importance of coming on time for her Depo shots. Patient states understanding.Burt Knack, RN

## 2021-07-18 NOTE — Telephone Encounter (Signed)
Patient left vm. The audio of the message did not have a clear connection and LCSW is not clear what the message request was for.

## 2021-07-18 NOTE — Progress Notes (Signed)
[facility] [address] Main Number: [phone]  Contraception/Family Planning VISIT ENCOUNTER NOTE  Subjective:   Makayla Booker is a 40 y.o. G98P1200 female here for reproductive life counseling. The patient is currently using Hormonal Injection to prevent pregnancy.  She is 23 weeks from her last injection.  She denies unprotected sex in the past 2 weeks. Desires reduced menses and effectiveness from [facility].  The patient does not want a pregnancy in the next year.     Denies abnormal vaginal bleeding, discharge, pelvic pain, problems with intercourse or other gynecologic concerns.    Gynecologic History No LMP recorded. Patient has had an injection.  Health Maintenance Due  Topic Date Due   COVID-19 Vaccine (1) Never done   Pneumococcal Vaccine 70-87 Years old (1 - PCV) Never done   Hepatitis C Screening  Never done   TETANUS/TDAP  Never done   INFLUENZA VACCINE  Never done     The following portions of the patient's history were reviewed and updated as appropriate: allergies, current medications, past family history, past medical history, past social history, past surgical history and problem list.  Review of Systems Pertinent items are noted in HPI.   Objective:  BP 106/71    Ht 5\' 8"  (1.727 m)    Wt 285 lb 9.6 oz (129.5 kg)    BMI 43.43 kg/m  Gen: well appearing, NAD HEENT: no scleral icterus CV: RR Lung: Normal WOB Ext: warm well perfused  Assessment and Plan:   Late for Depo  1. Unprotected sex Consulted due to late depo Patient denies any sex in the past 2 weeks.  She is 23 wks since last depo.  Physical is up to date with pap in sept 2022 UPT was negative. Safe to receive depo today

## 2021-07-18 NOTE — Progress Notes (Signed)
Patient here for Depo at 23 6/7. Last PE 11/09/2020 with E. Sciora, and Depo order written at that time. Last Pap 04/13/2021 NIL, HPV negative.Burt Knack, RN

## 2021-07-23 ENCOUNTER — Other Ambulatory Visit: Payer: Self-pay

## 2021-07-23 ENCOUNTER — Encounter: Payer: Self-pay | Admitting: Emergency Medicine

## 2021-07-23 ENCOUNTER — Emergency Department: Payer: Self-pay

## 2021-07-23 ENCOUNTER — Emergency Department
Admission: EM | Admit: 2021-07-23 | Discharge: 2021-07-23 | Disposition: A | Discharge status: Home / Self Care | Payer: Self-pay | Attending: Emergency Medicine | Admitting: Emergency Medicine

## 2021-07-23 DIAGNOSIS — M79661 Pain in right lower leg: Secondary | ICD-10-CM | POA: Insufficient documentation

## 2021-07-23 DIAGNOSIS — M79662 Pain in left lower leg: Secondary | ICD-10-CM | POA: Insufficient documentation

## 2021-07-23 DIAGNOSIS — R0609 Other forms of dyspnea: Secondary | ICD-10-CM | POA: Insufficient documentation

## 2021-07-23 DIAGNOSIS — I10 Essential (primary) hypertension: Secondary | ICD-10-CM | POA: Insufficient documentation

## 2021-07-23 DIAGNOSIS — R600 Localized edema: Secondary | ICD-10-CM | POA: Insufficient documentation

## 2021-07-23 DIAGNOSIS — Z79899 Other long term (current) drug therapy: Secondary | ICD-10-CM | POA: Insufficient documentation

## 2021-07-23 LAB — CBC
HCT: 37.6 % (ref 36.0–46.0)
Hemoglobin: 12.4 g/dL (ref 12.0–15.0)
MCH: 32 pg (ref 26.0–34.0)
MCHC: 33 g/dL (ref 30.0–36.0)
MCV: 96.9 fL (ref 80.0–100.0)
Platelets: 357 10*3/uL (ref 150–400)
RBC: 3.88 MIL/uL (ref 3.87–5.11)
RDW: 12.4 % (ref 11.5–15.5)
WBC: 8.3 10*3/uL (ref 4.0–10.5)
nRBC: 0 % (ref 0.0–0.2)

## 2021-07-23 LAB — URINALYSIS, COMPLETE (UACMP) WITH MICROSCOPIC
Bacteria, UA: NONE SEEN
Bilirubin Urine: NEGATIVE
Glucose, UA: NEGATIVE mg/dL
Ketones, ur: NEGATIVE mg/dL
Leukocytes,Ua: NEGATIVE
Nitrite: NEGATIVE
Protein, ur: NEGATIVE mg/dL
Specific Gravity, Urine: 1.019 (ref 1.005–1.030)
pH: 5 (ref 5.0–8.0)

## 2021-07-23 LAB — COMPREHENSIVE METABOLIC PANEL
ALT: 19 U/L (ref 0–44)
AST: 20 U/L (ref 15–41)
Albumin: 4.3 g/dL (ref 3.5–5.0)
Alkaline Phosphatase: 56 U/L (ref 38–126)
Anion gap: 7 (ref 5–15)
BUN: 16 mg/dL (ref 6–20)
CO2: 23 mmol/L (ref 22–32)
Calcium: 9.4 mg/dL (ref 8.9–10.3)
Chloride: 104 mmol/L (ref 98–111)
Creatinine, Ser: 0.76 mg/dL (ref 0.44–1.00)
GFR, Estimated: >60 mL/min (ref >60)
Glucose, Bld: 107 mg/dL — ABNORMAL HIGH (ref 70–99)
Potassium: 3.5 mmol/L (ref 3.5–5.1)
Sodium: 134 mmol/L — ABNORMAL LOW (ref 135–145)
Total Bilirubin: 0.7 mg/dL (ref 0.3–1.2)
Total Protein: 8.8 g/dL — ABNORMAL HIGH (ref 6.5–8.1)

## 2021-07-23 LAB — BRAIN NATRIURETIC PEPTIDE: B Natriuretic Peptide: 5.1 pg/mL (ref 0.0–100.0)

## 2021-07-23 MED ORDER — HYDROCHLOROTHIAZIDE 25 MG PO TABS: 25.0000 mg | ORAL_TABLET | Freq: Every day | ORAL | 0 refills | Status: DC

## 2021-07-23 MED ORDER — HYDROCHLOROTHIAZIDE 25 MG PO TABS
25.0000 mg | ORAL_TABLET | Freq: Every day | ORAL | 0 refills | Status: DC
  Filled 2021-07-23: qty 30, 30d supply, fill #0

## 2021-07-23 NOTE — Discharge Instructions (Signed)
Your blood work was reassuring including normal kidney function and it does not look like your swelling is related to just of heart failure. Leg swelling is a common side effect of amlodipine so I would like you to stop the amlodipine and start taking hydrochlorothiazide instead.

## 2021-07-23 NOTE — ED Triage Notes (Signed)
Pt reports her legs are swelling. Pt states that this has happened in the past but it usually goes down and this time it has not gone down. Pt reports the bottom of her feet hurt as well. Pt states last time they told her it was because she was out of her BP meds but this time she has been taking them the entire time. Pt denies SOB

## 2021-07-23 NOTE — ED Notes (Signed)
See triage note. Pt c.o bilateral leg swelling that has been ongoing for a few weeks. 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 Pt also endorses some burning and tingling on the bottom of her left foot. Pt states is not a diabetic but it runs in the family.

## 2021-07-23 NOTE — ED Provider Notes (Signed)
[facility]  ____________________________________________   Event Date/Time   First MD Initiated Contact with Patient 07/23/21 0825     (approximate)  I have reviewed the triage vital signs and the nursing notes.   HISTORY  Chief Complaint Leg Swelling    HPI Makayla Booker is a 40 y.o. female with past medical history of depression, hypertension who presents with lower extremity edema.  Patient has had transient swelling of her feet in the past but usually resolves when she elevates them.  Over the last 3 weeks she has had fairly chronic edema in the feet and calves.  She has pain in her lower extremities.  Has tried elevation but not compression stockings with minimal relief.  She does note some mild dyspnea on exertion which is new.  She denies chest pain fevers chills.  She is on amlodipine but has been on this for a while.         Past Medical History:  Diagnosis Date   Depression    Per client report.   History of abnormal cervical Pap smear    History of anemia    History of urinary tract infection    Hypertension    Hypertension    Migraines     Patient Active Problem List   Diagnosis Date Noted   Migraines dx'd age 89 11/09/2020   Depression 11/09/2020   Obesity BMI=42.6 02/12/2020   Elevated blood pressure reading 133/96 on 02/12/20/HTN 02/12/2020   Abnormal Pap smear of cervix 02/12/2020   H/O LEEP x2-- 05/06/2010 and 09/13/2016 02/12/2020    History reviewed. No pertinent surgical history.  Prior to Admission medications   Medication Sig Start Date End Date Taking? Authorizing Provider  hydrochlorothiazide (HYDRODIURIL) 25 MG tablet Take 1 tablet (25 mg total) by mouth daily. 07/23/21 08/22/21 Yes Georga Hacking, MD  amLODipine (NORVASC) 10 MG tablet Take 1 tablet (10 mg total) by mouth daily. 04/28/19   Fisher, Roselyn Bering, PA-C  oxyCODONE-acetaminophen (PERCOCET) 5-325 MG tablet Take 1 tablet by mouth every 6 (six) hours as needed  for severe pain. Patient not taking: Reported on 02/01/2021 12/24/20 12/24/21  Tommi Rumps, PA-C  gabapentin (NEURONTIN) 100 MG capsule Take 1 capsule (100 mg total) by mouth daily. Patient not taking: Reported on 11/19/2019 09/23/19 12/24/20  Tommi Rumps, PA-C  pantoprazole (PROTONIX) 20 MG tablet Take 1 tablet (20 mg total) by mouth daily. Patient not taking: Reported on 11/09/2020 08/03/20 12/24/20  Jene Every, MD    Allergies Penicillins  Family History  Problem Relation Age of Onset   Cancer Maternal Grandmother    Diabetes Mother    Hypertension Mother    Asthma Mother    Heart disease Paternal Aunt    Diabetes Paternal Aunt    Diabetes Father    Hypertension Sister    Thyroid disease Sister    Lupus Sister    Healthy Son     Social History Social History   Tobacco Use   Smoking status: Never   Smokeless tobacco: Never  Vaping Use   Vaping Use: Never used  Substance Use Topics   Alcohol use: Yes    Comment: Last ETOH use 10/2020.   Drug use: Never    Review of Systems   Review of Systems  Constitutional:  Negative for chills and fever.  Respiratory:  Positive for shortness of breath. Negative for chest tightness.   Cardiovascular:  Positive for leg swelling. Negative for chest pain.  Gastrointestinal:  Negative for abdominal pain, nausea and vomiting.  All other systems reviewed and are negative.  Physical Exam Updated Vital Signs BP (!) 140/97 (BP Location: Left Arm)    Pulse 92    Temp 98.5 F (36.9 C) (Oral)    Resp 20    Ht 5\' 9"  (1.753 m)    Wt 127 kg    SpO2 97%    BMI 41.35 kg/m   Physical Exam Vitals and nursing note reviewed.  Constitutional:      General: She is not in acute distress.    Appearance: Normal appearance.  HENT:     Head: Normocephalic and atraumatic.  Eyes:     General: No scleral icterus.    Conjunctiva/sclera: Conjunctivae normal.  Cardiovascular:     Rate and Rhythm: Normal rate and regular rhythm.  Pulmonary:      Effort: Pulmonary effort is normal. No respiratory distress.     Breath sounds: No stridor.  Musculoskeletal:        General: No deformity or signs of injury.     Cervical back: Normal range of motion.     Right lower leg: Edema present.     Left lower leg: Edema present.     Comments: Bilateral edema of the feet and calves, mild erythema, mildly tender to palpation, slight pitting, legs are symmetric  Skin:    General: Skin is dry.     Coloration: Skin is not jaundiced or pale.  Neurological:     General: No focal deficit present.     Mental Status: She is alert and oriented to person, place, and time. Mental status is at baseline.  Psychiatric:        Mood and Affect: Mood normal.        Behavior: Behavior normal.     LABS (all labs ordered are listed, but only abnormal results are displayed)  Labs Reviewed  COMPREHENSIVE METABOLIC PANEL - Abnormal; Notable for the following components:      Result Value   Sodium 134 (*)    Glucose, Bld 107 (*)    Total Protein 8.8 (*)    All other components within normal limits  URINALYSIS, COMPLETE (UACMP) WITH MICROSCOPIC - Abnormal; Notable for the following components:   Color, Urine YELLOW (*)    APPearance HAZY (*)    Hgb urine dipstick MODERATE (*)    All other components within normal limits  CBC  BRAIN NATRIURETIC PEPTIDE   ____________________________________________  EKG  Normal sinus rhythm, normal axis, normal intervals, mild T wave flattening in V3 through V6 ____________________________________________  RADIOLOGY I, , personally viewed and evaluated these images (plain radiographs) as part of my medical decision making, as well as reviewing the written report by the radiologist.  ED MD interpretation:  I reviewed the CXR which does not show any acute cardiopulmonary process      ____________________________________________   PROCEDURES  Procedure(s) performed (including Critical  Care):  Procedures   ____________________________________________   INITIAL IMPRESSION / ASSESSMENT AND PLAN / ED COURSE  Patient is a 40 year old female history of hypertension who presents with lower extremity edema x3 weeks.  She has some mild dyspnea but otherwise is asymptomatic.  Has had this transiently in the past but usually resolves with elevation.  Patient is on amlodipine although has been on this for a while.  Vital signs are largely unremarkable.  She does have significant lower extremity edema which is for the large part nonpitting and legs are symmetric.  Checked basic labs including a BNP and renal function and liver function which are all normal.  Her chest x-ray does not show any cardiomegaly or pulmonary edema.  She has clear lungs on exam.  Given peripheral edema is a common side effect of amlodipine I did advise that she stop this and I prescribed her HCTZ instead.  She does not currently have a primary doctor.  Advised that she follow-up with primary care for ongoing management of this.  Advised elevation and compression stockings.  Suspect venous stasis.  She is stable for discharge.      ____________________________________________   FINAL CLINICAL IMPRESSION(S) / ED DIAGNOSES  Final diagnoses:  Leg edema     ED Discharge Orders          Ordered    hydrochlorothiazide (HYDRODIURIL) 25 MG tablet  Daily        07/23/21 0953             Note:  This document was prepared using Dragon voice recognition software and may include unintentional dictation errors.    Georga Hacking, MD 07/23/21 [phone]

## 2021-07-24 ENCOUNTER — Other Ambulatory Visit: Payer: Self-pay

## 2021-08-03 ENCOUNTER — Encounter: Payer: Self-pay | Admitting: Family Medicine

## 2021-09-02 ENCOUNTER — Other Ambulatory Visit: Payer: Self-pay

## 2021-09-02 ENCOUNTER — Encounter: Payer: Self-pay | Admitting: Emergency Medicine

## 2021-09-02 ENCOUNTER — Emergency Department
Admission: EM | Admit: 2021-09-02 | Discharge: 2021-09-02 | Disposition: A | Discharge status: Home / Self Care | Payer: Self-pay | Attending: Emergency Medicine | Admitting: Emergency Medicine

## 2021-09-02 DIAGNOSIS — G43009 Migraine without aura, not intractable, without status migrainosus: Secondary | ICD-10-CM | POA: Insufficient documentation

## 2021-09-02 DIAGNOSIS — I10 Essential (primary) hypertension: Secondary | ICD-10-CM | POA: Insufficient documentation

## 2021-09-02 MED ORDER — PROCHLORPERAZINE EDISYLATE 10 MG/2ML IJ SOLN
10.0000 mg | Freq: Once | INTRAMUSCULAR | Status: AC
  Administered 2021-09-02: 10 mg via INTRAMUSCULAR
  Filled 2021-09-02: qty 2

## 2021-09-02 MED ORDER — METOCLOPRAMIDE HCL 10 MG PO TABS
10.0000 mg | ORAL_TABLET | Freq: Once | ORAL | Status: AC
  Administered 2021-09-02: 10 mg via ORAL
  Filled 2021-09-02: qty 1

## 2021-09-02 MED ORDER — METOCLOPRAMIDE HCL 10 MG PO TABS
10.0000 mg | ORAL_TABLET | Freq: Three times a day (TID) | ORAL | 0 refills | Status: DC
Start: 2021-09-02 — End: 2022-07-29

## 2021-09-02 MED ORDER — PROCHLORPERAZINE MALEATE 10 MG PO TABS
10.0000 mg | ORAL_TABLET | Freq: Four times a day (QID) | ORAL | 0 refills | Status: DC | PRN
Start: 2021-09-02 — End: 2022-07-29

## 2021-09-02 NOTE — Discharge Instructions (Addendum)
-  Return to the emergency department anytime if you begin to experience any new or worsening symptoms. -Take your medications as prescribed.

## 2021-09-02 NOTE — ED Triage Notes (Signed)
Pt via POV from home. Pt states she had 2 weeks of a migraine. 2 days ago she started have blurred vision and states nausea started today. Denies any light and photosensitivity. Pt is A&Ox4 and NAD. Pt has a hx of migraines.

## 2021-09-02 NOTE — ED Provider Triage Note (Signed)
°  Emergency Medicine Provider Triage Evaluation Note  Makayla Booker , a 41 y.o.female,  was evaluated in triage.  Pt complains of headache.  Patient states that she has been having ongoing headache for the past few weeks.  Reports getting worse over the past few days.  Requesting some medication for relief.  Denies visual loss, vomiting, chest pain/shortness of breath, or numbness/tingliness in upper or lower extremities.   Review of Systems  Positive: Headache Negative: Denies fever, chest pain, vomiting  Physical Exam   Vitals:   09/02/21 1139  BP: (!) 153/103  Pulse: 82  Resp: 18  Temp: 98.6 F (37 C)  SpO2: 98%   Gen:   Awake, no distress   Resp:  Normal effort  MSK:   Moves extremities without difficulty  Other:    Medical Decision Making  Given the patient's initial medical screening exam, the following diagnostic evaluation has been ordered. The patient will be placed in the appropriate treatment space, once one is available, to complete the evaluation and treatment. I have discussed the plan of care with the patient and I have advised the patient that an ED physician or mid-level practitioner will reevaluate their condition after the test results have been received, as the results may give them additional insight into the type of treatment they may need.    Diagnostics: None immediately  Treatments: Prochlorperazine, metoclopramide   Varney Daily, [place] 09/02/21 1144

## 2021-09-02 NOTE — ED Provider Notes (Signed)
[facility] Provider Note    None    (approximate)   History   Chief Complaint Migraine   HPI Makayla Booker is a 41 y.o. female, history of migraines, hypertension, depression, and anemia, presents emergency department for evaluation of headache.  Patient states that she has been having a migraine for the past 2 weeks.  Reports some blurred vision that started 2 days ago, as well as some nausea.  Denies any recent injuries.  Patient states that other than the blurred vision, this is consistent with her usual migraines.  She states that her medications that she has been using at home have not been helping her.  Denies fever/chills, neck pain, hearing loss, dizziness/vertigo, or chest pain/shortness of breath.  History Limitations: No limitations      Physical Exam  Triage Vital Signs: ED Triage Vitals  Enc Vitals Group     BP 09/02/21 1139 (!) 153/103     Pulse Rate 09/02/21 1139 82     Resp 09/02/21 1139 18     Temp 09/02/21 1139 98.6 F (37 C)     Temp Source 09/02/21 1139 Oral     SpO2 09/02/21 1139 98 %     Weight --      Height --      Head Circumference --      Peak Flow --      Pain Score 09/02/21 1137 0     Pain Loc --      Pain Edu? --      Excl. in GC? --     Most recent vital signs: Vitals:   09/02/21 1139  BP: (!) 153/103  Pulse: 82  Resp: 18  Temp: 98.6 F (37 C)  SpO2: 98%    General: Awake, NAD.  CV: Good peripheral perfusion.  Resp: Normal effort.  Abd: Soft, non-tender. No distention.  Neuro: At baseline. No neurological deficits. Other: EOMI. gross visual acuity intact.  Head is normocephalic and atraumatic.  Physical Exam    ED Results / Procedures / Treatments  Labs (all labs ordered are listed, but only abnormal results are displayed) Labs Reviewed - No data to display   EKG Not applicable.   RADIOLOGY  ED Provider Interpretation: None.  No results found.  PROCEDURES:  Critical Care  performed: None.  Procedures    MEDICATIONS ORDERED IN ED: Medications  metoCLOPramide (REGLAN) tablet 10 mg (10 mg Oral Given 09/02/21 1156)  prochlorperazine (COMPAZINE) injection 10 mg (10 mg Intramuscular Given 09/02/21 1155)     IMPRESSION / MDM / ASSESSMENT AND PLAN / ED COURSE  I reviewed the triage vital signs and the nursing notes.                              Makayla Booker is a 41 y.o. female, istory of migraines, hypertension, depression, and anemia, presents emergency department for evaluation of headache.  Patient states that she has been having a migraine for the past 2 weeks.  Reports some blurred vision that started 2 days ago, as well as some nausea.  Denies any recent injuries.  Differential diagnosis includes, but is not limited to, tension headache, cluster headache, migraine.   ED Course Patient appears well.  Vital signs are within normal limits.  She is afebrile.  Began treatment in triage with p.o. metoclopramide and prochlorperazine.  Assessment/Plan History and physical exam consistent with migraine.  Patient does endorse  blurred vision, however, her visual acuity is intact and she states that her symptoms are otherwise consistent with her usual migraines.  Upon reexamination following the initial treatment, she states that her migraine has mostly resolved.  She is requesting to go home.  I do not suspect any serious or life-threatening pathology given her presentation and vitals.  We will go ahead and discharge this patient home with a prescription for p.o. prochlorperazine and metoclopramide.  Encouraged the patient to follow-up with her primary care provider for chronic management of migraines.  Patient was provided with anticipatory guidance, return precautions, and educational material. Encouraged the patient to return to the emergency department at any time if they begin to experience any new or worsening symptoms.       FINAL CLINICAL IMPRESSION(S)  / ED DIAGNOSES   Final diagnoses:  Migraine without aura and without status migrainosus, not intractable     Rx / DC Orders   ED Discharge Orders          Ordered    metoCLOPramide (REGLAN) 10 MG tablet  3 times daily with meals        09/02/21 1312    prochlorperazine (COMPAZINE) 10 MG tablet  Every 6 hours PRN        09/02/21 1312             Note:  This document was prepared using Dragon voice recognition software and may include unintentional dictation errors.   Varney Daily, [place] 09/02/21 1544    Minna Antis, MD 09/02/21 [phone]

## 2021-09-18 ENCOUNTER — Other Ambulatory Visit: Payer: Self-pay

## 2021-09-18 ENCOUNTER — Emergency Department
Admission: EM | Admit: 2021-09-18 | Discharge: 2021-09-18 | Disposition: A | Discharge status: Home / Self Care | Payer: Self-pay | Attending: Emergency Medicine | Admitting: Emergency Medicine

## 2021-09-18 DIAGNOSIS — Y990 Civilian activity done for income or pay: Secondary | ICD-10-CM | POA: Insufficient documentation

## 2021-09-18 DIAGNOSIS — M545 Low back pain, unspecified: Secondary | ICD-10-CM | POA: Insufficient documentation

## 2021-09-18 DIAGNOSIS — X500XXA Overexertion from strenuous movement or load, initial encounter: Secondary | ICD-10-CM | POA: Insufficient documentation

## 2021-09-18 DIAGNOSIS — I10 Essential (primary) hypertension: Secondary | ICD-10-CM | POA: Insufficient documentation

## 2021-09-18 MED ORDER — LIDOCAINE 5 % EX PTCH
1.0000 | MEDICATED_PATCH | CUTANEOUS | Status: DC
  Administered 2021-09-18: 1 via TRANSDERMAL
  Filled 2021-09-18: qty 1

## 2021-09-18 MED ORDER — CYCLOBENZAPRINE HCL 10 MG PO TABS: 10.0000 mg | ORAL_TABLET | Freq: Three times a day (TID) | ORAL | 0 refills | Status: AC | PRN

## 2021-09-18 NOTE — ED Triage Notes (Signed)
Pt to ED for lower back pain for one week. Ambulatory. NAD noted.

## 2021-09-18 NOTE — ED Provider Notes (Signed)
[facility] Provider Note    Event Date/Time   First MD Initiated Contact with Patient 09/18/21 [phone]     (approximate)   History   Back Pain   HPI  Makayla Booker is a 41 y.o. female with past medical history of high blood pressure migraine headaches who presents for assessment of some lower mid back pain.  Patient states this started about 6 days ago when she was bending over doing some heavy lifting at work.  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  She has been taking Tylenol and ibuprofen but does not feel it helped much.  She denies any lower extremity weakness numbness or tingling, pelvic numbness, urinary or fecal incontinence, neck pain, mid back pain, rash, abdominal pain, fevers or any other clear associated sick symptoms.  No other recent injuries or falls.  She denies any history of malignancy, steroid use, significant weight loss, night sweats or IV drug use.      Physical Exam  Triage Vital Signs: ED Triage Vitals  Enc Vitals Group     BP 09/18/21 0732 (!) 148/110     Pulse Rate 09/18/21 0732 100     Resp 09/18/21 0731 18     Temp 09/18/21 0731 98 F (36.7 C)     Temp src --      SpO2 09/18/21 0732 97 %     Weight 09/18/21 0732 280 lb (127 kg)     Height 09/18/21 0732 5\' 8"  (1.727 m)     Head Circumference --      Peak Flow --      Pain Score 09/18/21 0732 10     Pain Loc --      Pain Edu? --      Excl. in GC? --     Most recent vital signs: Vitals:   09/18/21 0731 09/18/21 0732  BP:  (!) 148/110  Pulse:  100  Resp: 18   Temp: 98 F (36.7 C)   SpO2:  97%    General: Awake, no distress.  CV:  Good peripheral perfusion.  Resp:  Normal effort.  Abd:  No distention.  Other:  Some mild tenderness over the bilateral paralumbar muscles.  No significant midline tenderness.  No overlying skin changes.  No tenderness over the T or L-spine.  Patient has full strength and sensation throughout  the bilateral lower extremities.  Lower extremities otherwise warm and well-perfused.   ED Results / Procedures / Treatments  Labs (all labs ordered are listed, but only abnormal results are displayed) Labs Reviewed - No data to display   EKG     RADIOLOGY   PROCEDURES:  Critical Care performed: No  Procedures    MEDICATIONS ORDERED IN ED: Medications  lidocaine (LIDODERM) 5 % 1 patch (has no administration in time range)     IMPRESSION / MDM / ASSESSMENT AND PLAN / ED COURSE  I reviewed the triage vital signs and the nursing notes.                              Differential diagnosis includes, but is not limited to musculoskeletal strain versus possible stenosis or herniated disc.  There is no historical or exam features at this time to suggest an acute infectious process, conus medullaris, cauda equina, abscess, cellulitis, or other immediate life-threatening process.  Given no red flag  symptoms with stable vitals and reassuring exam and low suspicion for immediate life-threatening process setting patient is stable for discharge with continued outpatient follow-up.  Will place lidocaine patch and prescribe short course of muscle relaxants.  Discussed appropriate dosing of Tylenol and ibuprofen.  Advised continued use of stretching and returning to activity as tolerated.  Discharged in stable condition.  Strict return precautions advised and discussed.      FINAL CLINICAL IMPRESSION(S) / ED DIAGNOSES   Final diagnoses:  Acute low back pain without sciatica, unspecified back pain laterality  Hypertension, unspecified type     Rx / DC Orders   ED Discharge Orders          Ordered    cyclobenzaprine (FLEXERIL) 10 MG tablet  3 times daily PRN        09/18/21 7989             Note:  This document was prepared using Dragon voice recognition software and may include unintentional dictation errors.   Gilles Chiquito, MD 09/18/21 [phone]

## 2021-09-18 NOTE — ED Notes (Signed)
40 yof c/o mid back pain since last Tuesday. 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 The pt advised she has tried for the past week to treat the pain at home with over the counter meds but has had no relief. HX of HTN and allergies PCN

## 2021-10-10 ENCOUNTER — Ambulatory Visit (LOCAL_COMMUNITY_HEALTH_CENTER): Payer: Self-pay

## 2021-10-10 ENCOUNTER — Other Ambulatory Visit: Payer: Self-pay

## 2021-10-10 VITALS — BP 139/89 | Ht 68.0 in | Wt 279.0 lb

## 2021-10-10 DIAGNOSIS — Z3009 Encounter for other general counseling and advice on contraception: Secondary | ICD-10-CM

## 2021-10-10 DIAGNOSIS — Z111 Encounter for screening for respiratory tuberculosis: Secondary | ICD-10-CM

## 2021-10-10 DIAGNOSIS — Z30013 Encounter for initial prescription of injectable contraceptive: Secondary | ICD-10-CM

## 2021-10-10 NOTE — Progress Notes (Signed)
Patient here for PPD and Depo. Last PE was  11/09/2020. Last Depo was 07/18/2021, 12 weeks since last Depo. Patient counseled to schedule PE with next Depo due 12/26/21. Reminder card given.Burt Knack, RN  ?

## 2021-10-12 ENCOUNTER — Ambulatory Visit (LOCAL_COMMUNITY_HEALTH_CENTER): Payer: Self-pay

## 2021-10-12 ENCOUNTER — Other Ambulatory Visit: Payer: Self-pay

## 2021-10-12 DIAGNOSIS — Z111 Encounter for screening for respiratory tuberculosis: Secondary | ICD-10-CM

## 2021-10-12 LAB — TB SKIN TEST
Induration: 0 mm
TB Skin Test: NEGATIVE

## 2021-10-12 NOTE — Progress Notes (Signed)
PPDR 0 mm Negative. Read at 2:40 pm. Per TB coordinator (M Dorminy, RN) ok to read PPD at 48 hrs as pt will be traveling tomorrow and unable to return for PPDR tomorrow. Jerel Shepherd, RN ? ?

## 2021-11-02 ENCOUNTER — Other Ambulatory Visit: Payer: Self-pay

## 2021-11-02 ENCOUNTER — Emergency Department
Admission: EM | Admit: 2021-11-02 | Discharge: 2021-11-02 | Disposition: A | Discharge status: Home / Self Care | Payer: Self-pay | Attending: Emergency Medicine | Admitting: Emergency Medicine

## 2021-11-02 DIAGNOSIS — I10 Essential (primary) hypertension: Secondary | ICD-10-CM | POA: Insufficient documentation

## 2021-11-02 DIAGNOSIS — M545 Low back pain, unspecified: Secondary | ICD-10-CM

## 2021-11-02 DIAGNOSIS — M5386 Other specified dorsopathies, lumbar region: Secondary | ICD-10-CM

## 2021-11-02 DIAGNOSIS — G8929 Other chronic pain: Secondary | ICD-10-CM | POA: Insufficient documentation

## 2021-11-02 DIAGNOSIS — M5442 Lumbago with sciatica, left side: Secondary | ICD-10-CM | POA: Insufficient documentation

## 2021-11-02 MED ORDER — METHYLPREDNISOLONE SODIUM SUCC 125 MG IJ SOLR
125.0000 mg | Freq: Once | INTRAMUSCULAR | Status: AC
  Administered 2021-11-02: 125 mg via INTRAMUSCULAR
  Filled 2021-11-02: qty 2

## 2021-11-02 MED ORDER — LIDOCAINE 5 % EX PTCH
1.0000 | MEDICATED_PATCH | CUTANEOUS | Status: DC
  Administered 2021-11-02: 1 via TRANSDERMAL
  Filled 2021-11-02: qty 1

## 2021-11-02 MED ORDER — LIDOCAINE 5 % EX PTCH: 1.0000 | MEDICATED_PATCH | Freq: Two times a day (BID) | CUTANEOUS | 0 refills | Status: DC

## 2021-11-02 NOTE — ED Provider Notes (Signed)
? ?[facility] ?Provider Note ? ? ? Event Date/Time  ? First MD Initiated Contact with Patient 11/02/21 [phone]   ?  (approximate) ? ? ?History  ? ?Back Pain ? ? ?HPI ? ?Makayla Booker is a 41 y.o. female   presents to the ED with complaint of lumbar back pain with left sided sciatic problems.  Patient has a history of the same and has had a facet injection at [facility] which is given her relief.  Patient describes her pain as a numbness, burning pain in her back with radiation to her left leg.  She denies any urinary symptoms with this.  No recent falls or injuries reported.  Patient continues to ambulate without assistance.  Patient has history of hypertension, migraines, depression, chronic back pain. ? ?  ? ? ?Physical Exam  ? ?Triage Vital Signs: ?ED Triage Vitals [11/02/21 0726]  ?Enc Vitals Group  ?   BP (!) 155/111  ?   Pulse Rate 98  ?   Resp 18  ?   Temp 98.1 ?F (36.7 ?C)  ?   Temp Source Oral  ?   SpO2 96 %  ?   Weight   ?   Height   ?   Head Circumference   ?   Peak Flow   ?   Pain Score   ?   Pain Loc   ?   Pain Edu?   ?   Excl. in GC?   ? ? ?Most recent vital signs: ?Vitals:  ? 11/02/21 0726 11/02/21 0836  ?BP: (!) 155/111 (!) 161/105  ?Pulse: 98   ?Resp: 18   ?Temp: 98.1 ?F (36.7 ?C)   ?SpO2: 96%   ? ? ? ?General: Awake, no distress.  ?CV:  Good peripheral perfusion.  Heart regular rate and rhythm. ?Resp:  Normal effort.  Lungs are clear bilaterally. ?Abd:  No distention.  ?Other:  On examination of the back there is no gross deformity however there is moderate tenderness on palpation of the lumbar spine starting with approximately L2 and 3 and going down to L5-S1 area.  Straight leg raises are negative, good muscle strength at 5/5. ? ? ?ED Results / Procedures / Treatments  ? ?Labs ?(all labs ordered are listed, but only abnormal results are displayed) ?Labs Reviewed - No data to display ? ? ? ?PROCEDURES: ? ?Critical Care performed:  ? ?Procedures ? ? ?MEDICATIONS ORDERED IN  ED: ?Medications  ?methylPREDNISolone sodium succinate (SOLU-MEDROL) 125 mg/2 mL injection 125 mg (125 mg Intramuscular Given 11/02/21 0808)  ? ? ? ?IMPRESSION / MDM / ASSESSMENT AND PLAN / ED COURSE  ?I reviewed the triage vital signs and the nursing notes. ? ? ?Differential diagnosis includes, but is not limited to, acute exacerbation of low back pain, lumbar strain, urinary tract infection, sciatica left side. ? ?41 year old female presents to the ED with complaint of low back pain.  Patient has a history of chronic lumbar pain and states that  has she has an appointment on the 14th with a specialist.  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  Patient describes what sounds like facet injections done at [facility] which has helped.  She states that steroid tablets and gabapentin have given her no relief.  Physical exam is not concerning for a cauda equina.  Patient was willing to  try an injection of Solu-Medrol 125 mg and Lidoderm 5% patch.  ? ? ?FINAL CLINICAL IMPRESSION(S) / ED DIAGNOSES  ? ?Final diagnoses:  ?Acute exacerbation of chronic low back pain  ?Sciatica of left side associated with disorder of lumbar spine  ? ? ? ?Rx / DC Orders  ? ?ED Discharge Orders   ? ?      Ordered  ?  lidocaine (LIDODERM) 5 %  Every 12 hours       ? 11/02/21 0817  ? ?  ?  ? ?  ? ? ? ?Note:  This document was prepared using Dragon voice recognition software and may include unintentional dictation errors. ?  ?Tommi Rumps, PA-C ?11/02/21 1552 ? ?  ?Minna Antis, MD ?11/03/21 1459 ? ?

## 2021-11-02 NOTE — ED Triage Notes (Signed)
Pt c/o mid to lower back pain with some numbness/burning pain to the left leg , states she has chronic issues and has had injection in her back in the past. ?

## 2021-11-02 NOTE — ED Notes (Signed)
See triage note. Presents with lower back pain  denies any injury  describe pain as "burning" pain   states pain occasionally radiates into left leg and abd   ambulates well to treatment room ?

## 2021-11-02 NOTE — Discharge Instructions (Addendum)
Keep your appointment with the specialist that you have an appointment with on the 14th for evaluation of your back.  A prescription for the Lidoderm 5% patches was sent to the pharmacy in case they are helping.  Injection you received in the emergency department today should help with your back pain and left sciatic nerve pain.  Also take your blood pressure medication as prescribed by your primary care provider.  Your blood pressure initially in the emergency department was 155/111.  This may be elevated due to pain or your blood pressure is not probably being controlled. ?

## 2021-11-02 NOTE — ED Notes (Signed)
Pt's BP is 161/105 ?

## 2021-12-25 ENCOUNTER — Telehealth: Payer: Self-pay | Admitting: Licensed Clinical Social Worker

## 2021-12-25 NOTE — Telephone Encounter (Signed)
Patient left vm for LCSW requesting information on where to get medication management. LCSW returned call and provided information on [facility] and [facility].

## 2022-01-02 ENCOUNTER — Ambulatory Visit: Payer: Self-pay

## 2022-01-10 ENCOUNTER — Ambulatory Visit: Payer: Self-pay

## 2022-01-11 ENCOUNTER — Ambulatory Visit (LOCAL_COMMUNITY_HEALTH_CENTER): Payer: Self-pay | Admitting: Nurse Practitioner

## 2022-01-11 VITALS — BP 134/91 | Ht 68.0 in | Wt 285.0 lb

## 2022-01-11 DIAGNOSIS — Z3042 Encounter for surveillance of injectable contraceptive: Secondary | ICD-10-CM

## 2022-01-11 DIAGNOSIS — Z0000 Encounter for general adult medical examination without abnormal findings: Secondary | ICD-10-CM

## 2022-01-11 DIAGNOSIS — Z3009 Encounter for other general counseling and advice on contraception: Secondary | ICD-10-CM

## 2022-01-11 MED ORDER — MEDROXYPROGESTERONE ACETATE 150 MG/ML IM SUSP
150.0000 mg | INTRAMUSCULAR | Status: AC
  Administered 2022-01-11 – 2022-09-24 (×4): 150 mg via INTRAMUSCULAR

## 2022-01-11 NOTE — Progress Notes (Signed)
Pt does not want any STD testing done today. Here for PE and DEPO only. Depo given right arm and appointment card given for next injection. Keitha Butte RN

## 2022-01-11 NOTE — Progress Notes (Signed)
[facility] [place] Number: [id number]    Family Planning Visit- Initial Visit  Subjective:  Makayla Booker is a 41 y.o.  [id number]   being seen today for an initial annual visit and to discuss reproductive life planning.  The patient is currently using Hormonal Injection for pregnancy prevention. Patient reports   does not want a pregnancy in the next year.     report they are looking for a method that provides High efficacy at preventing pregnancy  Patient has the following medical conditions has Obesity BMI=42.6; Elevated blood pressure reading 133/96 on 02/12/20/HTN; Abnormal Pap smear of cervix; H/O LEEP x2-- 05/06/2010 and 09/13/2016; Migraines dx'd age 60; and Depression on their problem list.  Chief Complaint  Patient presents with   Contraception    PE and Depo    Patient reports to clinic today for a physical and birth control   Patient denies signs and symptoms    Body mass index is 43.33 kg/m. - Patient is eligible for diabetes screening based on BMI and age >65?  yes HA1C ordered? No, patient refused   Patient reports 1  partner/s in last year. Desires STI screening?  No - refused  Has patient been screened once for HCV in the past?  No  No results found for: "HCVAB"  Does the patient have current drug use (including MJ), have a partner with drug use, and/or has been incarcerated since last result? No  If yes-- Screen for HCV through [facility]   Does the patient meet criteria for HBV testing? No  Criteria:  -Household, sexual or needle sharing contact with HBV -History of drug use -HIV positive -Those with known Hep C   Health Maintenance Due  Topic Date Due   COVID-19 Vaccine (1) Never done   Hepatitis C Screening  Never done   TETANUS/TDAP  Never done    Review of Systems  Constitutional:  Negative for chills, fever, malaise/fatigue and weight loss.  HENT:  Negative for  congestion, hearing loss and sore throat.   Eyes:  Negative for blurred vision, double vision and photophobia.  Respiratory:  Negative for shortness of breath.   Cardiovascular:  Negative for chest pain.  Gastrointestinal:  Negative for abdominal pain, blood in stool, constipation, diarrhea, heartburn, nausea and vomiting.  Genitourinary:  Negative for dysuria and frequency.  Musculoskeletal:  Negative for back pain, joint pain and neck pain.  Skin:  Negative for itching and rash.  Neurological:  Negative for dizziness, weakness and headaches.  Endo/Heme/Allergies:  Does not bruise/bleed easily.  Psychiatric/Behavioral:  Negative for depression, substance abuse and suicidal ideas.     The following portions of the patient's history were reviewed and updated as appropriate: allergies, current medications, past family history, past medical history, past social history, past surgical history and problem list. Problem list updated.   See flowsheet for other program required questions.  Objective:   Vitals:   01/11/22 1005 01/11/22 1018 01/11/22 1021  BP: (!) 145/93 (!) 134/91 (!) 134/91  Weight: 285 lb (129.3 kg)    Height: 5\' 8"  (1.727 m)      Physical Exam Constitutional:      Appearance: Normal appearance.  HENT:     Head: Normocephalic.     Right Ear: External ear normal.     Left Ear: External ear normal.     Nose: Nose normal.     Mouth/Throat:     Lips: Pink.  Mouth: Mucous membranes are moist.     Comments: No visible signs of dental caries  Cardiovascular:     Rate and Rhythm: Normal rate.  Pulmonary:     Effort: Pulmonary effort is normal.     Breath sounds: Normal breath sounds.  Chest:     Comments: Breasts:        Right: Normal. No swelling, mass, nipple discharge, skin change or tenderness.        Left: Normal. No swelling, mass, nipple discharge, skin change or tenderness.   Abdominal:     General: Abdomen is flat. Bowel sounds are normal.      Palpations: Abdomen is soft.  Genitourinary:    Comments: Deferred, declines genital exam today  Musculoskeletal:     Cervical back: Full passive range of motion without pain, normal range of motion and neck supple.  Skin:    General: Skin is warm and dry.  Neurological:     Mental Status: She is alert and oriented to person, place, and time.  Psychiatric:        Attention and Perception: Attention normal.        Mood and Affect: Mood normal.        Speech: Speech normal.        Behavior: Behavior normal. Behavior is cooperative.       Assessment and Plan:  Makayla Booker is a 41 y.o. female presenting to the [facility] Department for an initial annual wellness/contraceptive visit  Contraception counseling: Reviewed options based on patient desire and reproductive life plan. Patient is interested in Hormonal Injection. This was provided to the patient today.   Risks, benefits, and typical effectiveness rates were reviewed.  Questions were answered.  Written information was also given to the patient to review.    The patient will follow up in  11 weeks for surveillance.  The patient was told to call with any further questions, or with any concerns about this method of contraception.  Emphasized use of condoms 100% of the time for STI prevention.  Need for ECP was assessed. ECP not ordered due to continuous use of a birth control method.  1. Family planning counseling 41 year old female in clinic today for a physical. -ROS reviewed, no complaints. -Desires to use Depo as a birth control method. -Declines all STD screening and blood work.    2. Well woman exam (no gynecological exam) -Normal well woman exam. -PAP due 04/2024 -CBE today, next due 01/2023  3. Surveillance for Depo-Provera contraception -Patient desires continue with Depo.  Patient may have Depo 150 MG IM q11-13 weeks.   - medroxyPROGESTERone (DEPO-PROVERA) injection 150 mg     Return in about 1  year (around 01/12/2023) for Annual well-woman exam, 11 weeks for Depo .    Gregary Cromer, FNP

## 2022-01-15 ENCOUNTER — Emergency Department: Payer: Self-pay

## 2022-01-15 ENCOUNTER — Emergency Department
Admission: EM | Admit: 2022-01-15 | Discharge: 2022-01-15 | Disposition: A | Discharge status: Home / Self Care | Payer: Self-pay | Attending: Emergency Medicine | Admitting: Emergency Medicine

## 2022-01-15 ENCOUNTER — Encounter: Payer: Self-pay | Admitting: Emergency Medicine

## 2022-01-15 ENCOUNTER — Other Ambulatory Visit: Payer: Self-pay

## 2022-01-15 DIAGNOSIS — I10 Essential (primary) hypertension: Secondary | ICD-10-CM | POA: Insufficient documentation

## 2022-01-15 DIAGNOSIS — G43809 Other migraine, not intractable, without status migrainosus: Secondary | ICD-10-CM | POA: Insufficient documentation

## 2022-01-15 LAB — URINALYSIS, ROUTINE W REFLEX MICROSCOPIC
Bacteria, UA: NONE SEEN
Bilirubin Urine: NEGATIVE
Glucose, UA: NEGATIVE mg/dL
Ketones, ur: NEGATIVE mg/dL
Leukocytes,Ua: NEGATIVE
Nitrite: NEGATIVE
Protein, ur: NEGATIVE mg/dL
Specific Gravity, Urine: 1.023 (ref 1.005–1.030)
pH: 5 (ref 5.0–8.0)

## 2022-01-15 LAB — BASIC METABOLIC PANEL
Anion gap: 7 (ref 5–15)
BUN: 13 mg/dL (ref 6–20)
CO2: 23 mmol/L (ref 22–32)
Calcium: 9.4 mg/dL (ref 8.9–10.3)
Chloride: 108 mmol/L (ref 98–111)
Creatinine, Ser: 0.74 mg/dL (ref 0.44–1.00)
GFR, Estimated: >60 mL/min (ref >60)
Glucose, Bld: 106 mg/dL — ABNORMAL HIGH (ref 70–99)
Potassium: 3.4 mmol/L — ABNORMAL LOW (ref 3.5–5.1)
Sodium: 138 mmol/L (ref 135–145)

## 2022-01-15 LAB — CBC
HCT: 39 % (ref 36.0–46.0)
Hemoglobin: 12.7 g/dL (ref 12.0–15.0)
MCH: 31.8 pg (ref 26.0–34.0)
MCHC: 32.6 g/dL (ref 30.0–36.0)
MCV: 97.5 fL (ref 80.0–100.0)
Platelets: 351 10*3/uL (ref 150–400)
RBC: 4 MIL/uL (ref 3.87–5.11)
RDW: 13.1 % (ref 11.5–15.5)
WBC: 7.8 10*3/uL (ref 4.0–10.5)
nRBC: 0 % (ref 0.0–0.2)

## 2022-01-15 LAB — POC URINE PREG, ED: Preg Test, Ur: NEGATIVE

## 2022-01-15 MED ORDER — GADOBUTROL 1 MMOL/ML IV SOLN
10.0000 mL | Freq: Once | INTRAVENOUS | Status: AC | PRN
  Administered 2022-01-15: 10 mL via INTRAVENOUS

## 2022-01-15 MED ORDER — HYDROCHLOROTHIAZIDE 25 MG PO TABS
25.0000 mg | ORAL_TABLET | Freq: Every day | ORAL | Status: DC
  Administered 2022-01-15: 25 mg via ORAL
  Filled 2022-01-15: qty 1

## 2022-01-15 MED ORDER — HYDROCHLOROTHIAZIDE 25 MG PO TABS: 25.0000 mg | ORAL_TABLET | Freq: Every day | ORAL | 1 refills | Status: DC

## 2022-01-15 MED ORDER — LORAZEPAM 2 MG/ML IJ SOLN
0.5000 mg | Freq: Once | INTRAMUSCULAR | Status: AC
  Administered 2022-01-15: 0.5 mg via INTRAVENOUS
  Filled 2022-01-15: qty 1

## 2022-01-15 MED ORDER — METOCLOPRAMIDE HCL 5 MG/ML IJ SOLN
10.0000 mg | Freq: Once | INTRAMUSCULAR | Status: AC
  Administered 2022-01-15: 10 mg via INTRAVENOUS
  Filled 2022-01-15: qty 2

## 2022-01-15 MED ORDER — DIPHENHYDRAMINE HCL 50 MG/ML IJ SOLN
50.0000 mg | Freq: Once | INTRAMUSCULAR | Status: AC
  Administered 2022-01-15: 50 mg via INTRAVENOUS
  Filled 2022-01-15: qty 1

## 2022-01-15 MED ORDER — SODIUM CHLORIDE 0.9 % IV BOLUS
500.0000 mL | Freq: Once | INTRAVENOUS | Status: AC
  Administered 2022-01-15: 500 mL via INTRAVENOUS

## 2022-01-15 NOTE — ED Triage Notes (Signed)
Pt via POV from home. Pt c/o headache and blurred vision since last night states that she been hypertensive but has not had a PCP for the past month so she has not been able to get it filled. Pt is A&OX4 and NAD

## 2022-01-15 NOTE — ED Triage Notes (Signed)
C/O headache since last night and intermittent light spots and blurred vision.  Patient states she has history of HTN, non complaint with medications "for a while".  AAOx3.  Skin warm and dry. NAD

## 2022-01-15 NOTE — ED Provider Notes (Signed)
[facility] Provider Note  Patient Contact: 9:03 PM (approximate)   History   Headache   HPI  Makayla Booker is a 41 y.o. female with a history of hypertension, migraines and anemia, presents to the emergency department with an atypical headache.  Patient states that she has felt dizzy and has had black spots in her vision.  She reports that headache started this morning and is frontal in nature.  She denies falls or mechanisms of trauma.  She states that she has been out of her antihypertensive medication, hydrochlorothiazide for the past several weeks.  She has associated her headache with hypertension at home.  She denies changes in caffeine intake or sleep.  No fever or chills at home.  No neck stiffness.      Physical Exam   Triage Vital Signs: ED Triage Vitals  Enc Vitals Group     BP 01/15/22 1451 (!) 167/92     Pulse Rate 01/15/22 1451 91     Resp 01/15/22 1451 18     Temp 01/15/22 1451 98.3 F (36.8 C)     Temp Source 01/15/22 1451 Oral     SpO2 01/15/22 1451 98 %     Weight 01/15/22 1449 282 lb (127.9 kg)     Height 01/15/22 1449 5\' 8"  (1.727 m)     Head Circumference --      Peak Flow --      Pain Score 01/15/22 1449 8     Pain Loc --      Pain Edu? --      Excl. in GC? --     Most recent vital signs: Vitals:   01/15/22 1451 01/15/22 1734  BP: (!) 167/92 (!) 168/96  Pulse: 91 88  Resp: 18 18  Temp: 98.3 F (36.8 C)   SpO2: 98% 98%     General: Alert and in no acute distress. Eyes:  PERRL. EOMI. Head: No acute traumatic findings ENT:      Ears: Tms are pearly.       Nose: No congestion/rhinnorhea.      Mouth/Throat: Mucous membranes are moist. Neck: No stridor. No cervical spine tenderness to palpation. Cardiovascular:  Good peripheral perfusion Respiratory: Normal respiratory effort without tachypnea or retractions. Lungs CTAB. Good air entry to the bases with no decreased or absent breath sounds. Gastrointestinal:  Bowel sounds 4 quadrants. Soft and nontender to palpation. No guarding or rigidity. No palpable masses. No distention. No CVA tenderness. Musculoskeletal: Full range of motion to all extremities.  Neurologic: Cranial nerves II through XII are intact.  No gross focal neurologic deficits are appreciated.  Skin:   No rash noted Other:   ED Results / Procedures / Treatments   Labs (all labs ordered are listed, but only abnormal results are displayed) Labs Reviewed  BASIC METABOLIC PANEL - Abnormal; Notable for the following components:      Result Value   Potassium 3.4 (*)    Glucose, Bld 106 (*)    All other components within normal limits  URINALYSIS, ROUTINE W REFLEX MICROSCOPIC - Abnormal; Notable for the following components:   Color, Urine YELLOW (*)    APPearance HAZY (*)    Hgb urine dipstick SMALL (*)    All other components within normal limits  CBC  POC URINE PREG, ED        RADIOLOGY  I personally viewed and evaluated these images as part of my medical decision making, as well as reviewing the  written report by the radiologist.  ED Provider Interpretation: I personally interpreted MRI brain and agree with radiologist interpretation.  No acute abnormality.   PROCEDURES:  Critical Care performed: No  Procedures   MEDICATIONS ORDERED IN ED: Medications  hydrochlorothiazide (HYDRODIURIL) tablet 25 mg (25 mg Oral Given 01/15/22 1901)  sodium chloride 0.9 % bolus 500 mL (0 mLs Intravenous Stopped 01/15/22 2301)  metoCLOPramide (REGLAN) injection 10 mg (10 mg Intravenous Given 01/15/22 1853)  diphenhydrAMINE (BENADRYL) injection 50 mg (50 mg Intravenous Given 01/15/22 1853)  LORazepam (ATIVAN) injection 0.5 mg (0.5 mg Intravenous Given 01/15/22 1951)  gadobutrol (GADAVIST) 1 MMOL/ML injection 10 mL (10 mLs Intravenous Contrast Given 01/15/22 2041)     IMPRESSION / MDM / ASSESSMENT AND PLAN / ED COURSE  I reviewed the triage vital signs and the nursing notes.                               Assessment and plan:  Headache:  41 year old female presents to the emergency department with an atypical headache which includes dizziness and aura of blurry vision.  Patient was hypertensive at triage but vital signs otherwise reassuring.  Patient had no neurodeficits on exam.  Initial CT head indicated possible fat-containing lesion contained within the sella.  Follow-up MRI indicated empty sella without other acute abnormality  Patient's headache completely resolved with Reglan and Benadryl.  Patient's hydrochlorothiazide was refilled and patient was advised to follow-up with primary care.   FINAL CLINICAL IMPRESSION(S) / ED DIAGNOSES   Final diagnoses:  Other migraine without status migrainosus, not intractable     Rx / DC Orders   ED Discharge Orders          Ordered    hydrochlorothiazide (HYDRODIURIL) 25 MG tablet  Daily        01/15/22 2304             Note:  This document was prepared using Dragon voice recognition software and may include unintentional dictation errors.   Pia Mau [place], Cordelia Poche 01/15/22 2307    Sharyn Creamer, MD 01/15/22 2324

## 2022-01-15 NOTE — ED Notes (Signed)
See triage note  presents with frontal headache since last pm    states she also noticed that her b/p was elevated and she was seeing "spots"   has been out of b/p meds for awhile

## 2022-04-11 ENCOUNTER — Ambulatory Visit: Payer: Self-pay

## 2022-04-13 ENCOUNTER — Ambulatory Visit (LOCAL_COMMUNITY_HEALTH_CENTER): Payer: Self-pay

## 2022-04-13 VITALS — BP 160/85 | Ht 68.0 in | Wt 286.0 lb

## 2022-04-13 DIAGNOSIS — Z3042 Encounter for surveillance of injectable contraceptive: Secondary | ICD-10-CM

## 2022-04-13 DIAGNOSIS — Z3009 Encounter for other general counseling and advice on contraception: Secondary | ICD-10-CM

## 2022-04-13 NOTE — Progress Notes (Signed)
13 weeks 1 day post depo. BP elevated today at 160/85. Pt reports she has not taken BP meds today and has lots of stress. States she has no PCP and no insurance, and was denied medicaid. Received BP meds from ER. Local provider resource list given and encouraged to call [facility]. Pt states she has gone to [facility] in the past but explains there are too many forms to fill out.   Consult A White, FNP who gives ok for depo today based on her original order dated 01/11/2022. Marland Kitchen Depo tolerated well L delt. Next depo due 06/29/2022, pt aware. Jerel Shepherd, RN

## 2022-04-16 NOTE — Progress Notes (Signed)
Consulted by RN re: patient situation.  Reviewed RN note and agree that it reflects our discussion and my recommendations. Caprice Mccaffrey, FNP  

## 2022-05-16 ENCOUNTER — Other Ambulatory Visit: Payer: Self-pay

## 2022-05-16 ENCOUNTER — Encounter: Payer: Self-pay | Admitting: Intensive Care

## 2022-05-16 ENCOUNTER — Emergency Department
Admission: EM | Admit: 2022-05-16 | Discharge: 2022-05-16 | Disposition: A | Discharge status: Home / Self Care | Payer: Self-pay | Attending: Emergency Medicine | Admitting: Emergency Medicine

## 2022-05-16 DIAGNOSIS — M5442 Lumbago with sciatica, left side: Secondary | ICD-10-CM | POA: Insufficient documentation

## 2022-05-16 DIAGNOSIS — M5432 Sciatica, left side: Secondary | ICD-10-CM

## 2022-05-16 MED ORDER — METHYLPREDNISOLONE SODIUM SUCC 125 MG IJ SOLR
80.0000 mg | Freq: Once | INTRAMUSCULAR | Status: AC
Start: 2022-05-16 — End: 2022-05-16
  Administered 2022-05-16: 80 mg via INTRAMUSCULAR
  Filled 2022-05-16: qty 2

## 2022-05-16 NOTE — ED Provider Notes (Signed)
[facility] Provider Note    None    (approximate)   History   Leg Pain   HPI  Makayla Booker is a 41 y.o. female   presents with left lower back pain and radiation down left leg for one month.    No recent injury, but has history of low back pain and possible sciatica.  Patient states this is similar to that episode and shot of cortisone helped. Was seen at [facility] 2 years ago and MRI negative.     Physical Exam   Triage Vital Signs: ED Triage Vitals  Enc Vitals Group     BP 05/16/22 0806 (!) 160/109     Pulse Rate 05/16/22 0806 73     Resp 05/16/22 0806 16     Temp 05/16/22 0806 98.2 F (36.8 C)     Temp Source 05/16/22 0806 Oral     SpO2 05/16/22 0806 98 %     Weight 05/16/22 0810 280 lb (127 kg)     Height 05/16/22 0810 5\' 8"  (1.727 m)     Head Circumference --      Peak Flow --      Pain Score 05/16/22 0810 10     Pain Loc --      Pain Edu? --      Excl. in GC? --     Most recent vital signs: Vitals:   05/16/22 0806  BP: (!) 160/109  Pulse: 73  Resp: 16  Temp: 98.2 F (36.8 C)  SpO2: 98%     General: Awake, no distress.  CV:  Good peripheral perfusion.  Resp:  Normal effort.  Abd:  No distention.  Other:  No point tenderness on palpation of the lumbar spine however there is marked tenderness on palpation of the left SI joint area which reproduces patient's pain.  Straight leg raises are negative.  Good muscle strength bilaterally at 5/5.  Patient is ambulatory without assistance.   ED Results / Procedures / Treatments   Labs (all labs ordered are listed, but only abnormal results are displayed) Labs Reviewed - No data to display   PROCEDURES:  Critical Care performed:   Procedures   MEDICATIONS ORDERED IN ED: Medications  methylPREDNISolone sodium succinate (SOLU-MEDROL) 125 mg/2 mL injection 80 mg (80 mg Intramuscular Given 05/16/22 0939)     IMPRESSION / MDM / ASSESSMENT AND PLAN / ED COURSE  I reviewed the  triage vital signs and the nursing notes.   Differential diagnosis includes, but is not limited to, low back pain, low back strain, sciatica with right leg radiculopathy.  41 year old female presents to the ED with complaint of left lower leg radiculopathy with low back pain.  Patient has had symptoms similar to this before and was seen at [facility] 2 years ago.  MRI from that visit was reviewed and radiology report is negative.  Patient states she is continue to take her blood pressure medication daily but did not take it today.  In the past she has taken pain medication and muscle relaxants without any relief of her pain.  Records indicate that the Solu-Medrol helped more than anything.  Patient was given an injection while in the ED today.  She is aware that if she continues to have symptoms she may need to follow-up with her doctor at [facility] for continued evaluation.      Patient's presentation is most consistent with acute, uncomplicated illness.  FINAL CLINICAL IMPRESSION(S) / ED DIAGNOSES  Final diagnoses:  Sciatica of left side     Rx / DC Orders   ED Discharge Orders     None        Note:  This document was prepared using Dragon voice recognition software and may include unintentional dictation errors.   Johnn Hai, PA-C 05/16/22 1144    Vanessa [place], MD 05/16/22 [phone]

## 2022-05-16 NOTE — Discharge Instructions (Addendum)
Call the clinics listed on your discharge papers to obtain a primary care provider.  This way your hypertension can be better managed to prevent complications that can occur from high blood pressure.  Today you were given an injection of steroids.  You may use ice or heat to your back as needed for discomfort.  If not improving it may be necessary for you to see your doctor at [facility] that you had in the past.

## 2022-05-16 NOTE — ED Triage Notes (Signed)
Patient c/o left lower back pain that radiates down left leg and gives out sometimes. Reports same issue about two years ago and given Cortizone shot with relief.

## 2022-06-25 ENCOUNTER — Ambulatory Visit: Payer: Self-pay

## 2022-07-05 ENCOUNTER — Ambulatory Visit (LOCAL_COMMUNITY_HEALTH_CENTER): Payer: Self-pay

## 2022-07-05 VITALS — BP 119/66 | Ht 68.0 in | Wt 287.5 lb

## 2022-07-05 DIAGNOSIS — Z3042 Encounter for surveillance of injectable contraceptive: Secondary | ICD-10-CM

## 2022-07-05 DIAGNOSIS — Z3009 Encounter for other general counseling and advice on contraception: Secondary | ICD-10-CM

## 2022-07-05 NOTE — Progress Notes (Signed)
11 weeks 6 days post depo. BP today 119/66. Hx of elevated BP.  Pt explains she has no insurance and no PCP, but her [employer] risk surveyor is taking her to a provider in [place] today. Unsure of name.  List of BP readings over past 6 months given to patient to take to provider.  Patient states she is currently taking a "depression" med and a "sleep" med. Unsure of names.   Voices no concerns regarding depo. Depo given today per order by Onalee Hua, FNP dated 01/11/2022. Tolerated well R delt. Next depo due 09/20/2021, reminder card given. Jerel Shepherd, RN

## 2022-07-29 ENCOUNTER — Emergency Department
Admission: EM | Admit: 2022-07-29 | Discharge: 2022-07-29 | Disposition: A | Discharge status: Home / Self Care | Payer: Self-pay | Attending: Emergency Medicine | Admitting: Emergency Medicine

## 2022-07-29 ENCOUNTER — Other Ambulatory Visit: Payer: Self-pay

## 2022-07-29 DIAGNOSIS — R030 Elevated blood-pressure reading, without diagnosis of hypertension: Secondary | ICD-10-CM | POA: Insufficient documentation

## 2022-07-29 DIAGNOSIS — I10 Essential (primary) hypertension: Secondary | ICD-10-CM | POA: Insufficient documentation

## 2022-07-29 DIAGNOSIS — Z20822 Contact with and (suspected) exposure to covid-19: Secondary | ICD-10-CM | POA: Insufficient documentation

## 2022-07-29 DIAGNOSIS — J210 Acute bronchiolitis due to respiratory syncytial virus: Secondary | ICD-10-CM | POA: Insufficient documentation

## 2022-07-29 LAB — RESP PANEL BY RT-PCR (RSV, FLU A&B, COVID)  RVPGX2
Influenza A by PCR: NEGATIVE
Influenza B by PCR: NEGATIVE
Resp Syncytial Virus by PCR: POSITIVE — AB
SARS Coronavirus 2 by RT PCR: NEGATIVE

## 2022-07-29 MED ORDER — GUAIFENESIN-CODEINE 100-10 MG/5ML PO SOLN: 5.0000 mL | Freq: Four times a day (QID) | ORAL | 0 refills | Status: DC | PRN

## 2022-07-29 NOTE — Discharge Instructions (Addendum)
Follow-up with your primary care provider if any continued problems.  Return to the emergency department if any severe worsening of your symptoms such as difficulty breathing or shortness of breath.  Your blood pressure was elevated while in the emergency department which may be due to coughing and you feeling not well.  Your blood pressure should be rechecked in approximately 1 to 2 weeks.  If you do not have a primary care provider a list of providers in the area is on your discharge papers.  Continue to take ibuprofen or Tylenol as needed for body aches, headache or fever.  A prescription for cough medication was sent to the pharmacy to take as needed for cough.  Be aware that this medication does contain a narcotic and could cause drowsiness.  Please go to the following website to schedule new (and existing) patient appointments:   [email or web address]   The following is a list of primary care offices in the area who are accepting new patients at this time.  Please reach out to one of them directly and let them know you would like to schedule an appointment to follow up on an Emergency Department visit, and/or to establish a new primary care provider (PCP).  There are likely other primary care clinics in the are who are accepting new patients, but this is an excellent place to start:  [facility] Lead physician: Dr Shirlee Latch [address] [place], [place] [address] [phone]  [facility] Lead Physician: Dr Alba Cory [address], [place], [place] [address] [phone]  [facility]  Lead Physician: Dr Olevia Perches [address] [place], [place] [address] [phone]  [facility] Lead Physician: Dr Sofie Hartigan [address] [place], [place] [address] [phone]  [facility] Lead Physician: Dr Bari Edward [address] [place], [place], [place] [address] [phone]

## 2022-07-29 NOTE — ED Provider Notes (Signed)
[facility] Provider Note    Event Date/Time   First MD Initiated Contact with Patient 07/29/22 [id number]     (approximate)   History   Cough and Generalized Body Aches   HPI  Makayla Booker is a 41 y.o. female   presents to the ED with complaint of cough and bodyaches for the last several days.  Patient states that her daughters friend has similar symptoms.  Patient states that she has been tested for COVID which has been negative.  She denies any vomiting or diarrhea.  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  Patient has a history of hypertension, anemia, migraines and depression.      Physical Exam   Triage Vital Signs: ED Triage Vitals  Enc Vitals Group     BP 07/29/22 0757 (!) 160/117     Pulse Rate 07/29/22 0757 89     Resp 07/29/22 0757 18     Temp 07/29/22 0757 98.5 F (36.9 C)     Temp Source 07/29/22 0757 Oral     SpO2 07/29/22 0757 100 %     Weight 07/29/22 0758 285 lb (129.3 kg)     Height 07/29/22 0758 5\' 8"  (1.727 m)     Head Circumference --      Peak Flow --      Pain Score 07/29/22 0757 8     Pain Loc --      Pain Edu? --      Excl. in GC? --     Most recent vital signs: Vitals:   07/29/22 0757  BP: (!) 160/117  Pulse: 89  Resp: 18  Temp: 98.5 F (36.9 C)  SpO2: 100%     General: Awake, no distress.  CV:  Good peripheral perfusion.  Resp:  Normal effort.  Lungs are clear bilaterally.  Occasional cough is noted during exam. Abd:  No distention.  Other:     ED Results / Procedures / Treatments   Labs (all labs ordered are listed, but only abnormal results are displayed) Labs Reviewed  RESP PANEL BY RT-PCR (RSV, FLU A&B, COVID)  RVPGX2 - Abnormal; Notable for the following components:      Result Value   Resp Syncytial Virus by PCR POSITIVE (*)    All other components within normal limits      PROCEDURES:  Critical Care performed:    Procedures   MEDICATIONS ORDERED IN ED: Medications - No data to display   IMPRESSION / MDM / ASSESSMENT AND PLAN / ED COURSE  I reviewed the triage vital signs and the nursing notes.   Differential diagnosis includes, but is not limited to, COVID, influenza, RSV, bronchitis, viral illness.  41 year old female presents to the ED with complaint of cough and not feeling well for several days.  Physical exam is consistent with a viral URI without difficulty breathing.  Patient's O2 sat is 100%.  She tested positive for RSV but negative for COVID and influenza was made aware.  A note for her to remain out of work was written.  A prescription for Robitussin AC was sent to the pharmacy.  She is strongly encouraged to follow-up with her PCP to have her blood pressure rechecked as it was elevated while in the ED.      Patient's presentation is most consistent with acute complicated illness / injury requiring diagnostic workup.  FINAL CLINICAL IMPRESSION(S) / ED DIAGNOSES   Final diagnoses:  RSV (acute bronchiolitis due to respiratory syncytial virus)  Elevated blood pressure reading     Rx / DC Orders   ED Discharge Orders          Ordered    guaiFENesin-codeine 100-10 MG/5ML syrup  Every 6 hours PRN        07/29/22 0933             Note:  This document was prepared using Dragon voice recognition software and may include unintentional dictation errors.   Tommi Rumps, PA-C 07/29/22 1351    Arnaldo Natal, MD 07/29/22 1415

## 2022-07-29 NOTE — ED Triage Notes (Signed)
Pt states cough and body aches for the last several days. Pt states that her daughters friend was sick with similar symptoms. Pt walking with no difficulty. Pt states she has been tested for covid and it was negative but has a concern for flu.

## 2022-09-24 ENCOUNTER — Ambulatory Visit: Payer: Self-pay

## 2022-09-24 ENCOUNTER — Ambulatory Visit (LOCAL_COMMUNITY_HEALTH_CENTER): Payer: Self-pay

## 2022-09-24 VITALS — BP 139/94 | Ht 68.0 in | Wt 287.5 lb

## 2022-09-24 DIAGNOSIS — Z3009 Encounter for other general counseling and advice on contraception: Secondary | ICD-10-CM

## 2022-09-24 DIAGNOSIS — Z3042 Encounter for surveillance of injectable contraceptive: Secondary | ICD-10-CM

## 2022-09-24 NOTE — Progress Notes (Signed)
11 weeks 4 days post depo. BP elevated today at 139/94. States she has not taken her HBP med today. Has regular f-u with PCP at [facility].    Consult Art Buff, FNP who gives ok for patient to have depo today based on original order by Collene Leyden, FNP dated 01/11/2022. Advises patient to take her HBP med today and to f-u with PCP.   RN carried out provider orders. Patient in agreement and states she has f-u with PCP on Thursday.  Depo given and tolerated well L delt. Next depo due 12/10/2022, has reminder. Josie Saunders, RN

## 2022-10-11 ENCOUNTER — Emergency Department
Admission: EM | Admit: 2022-10-11 | Discharge: 2022-10-11 | Disposition: A | Discharge status: Home / Self Care | Payer: Self-pay | Attending: Emergency Medicine | Admitting: Emergency Medicine

## 2022-10-11 DIAGNOSIS — I10 Essential (primary) hypertension: Secondary | ICD-10-CM | POA: Insufficient documentation

## 2022-10-11 DIAGNOSIS — G8929 Other chronic pain: Secondary | ICD-10-CM | POA: Insufficient documentation

## 2022-10-11 DIAGNOSIS — X501XXA Overexertion from prolonged static or awkward postures, initial encounter: Secondary | ICD-10-CM | POA: Insufficient documentation

## 2022-10-11 DIAGNOSIS — M5442 Lumbago with sciatica, left side: Secondary | ICD-10-CM | POA: Insufficient documentation

## 2022-10-11 MED ORDER — LIDOCAINE 5 % EX PTCH
1.0000 | MEDICATED_PATCH | CUTANEOUS | Status: DC
  Administered 2022-10-11: 1 via TRANSDERMAL
  Filled 2022-10-11: qty 1

## 2022-10-11 MED ORDER — HYDROCODONE-ACETAMINOPHEN 5-325 MG PO TABS
1.0000 | ORAL_TABLET | Freq: Three times a day (TID) | ORAL | 0 refills | Status: AC | PRN
Start: 2022-10-11 — End: 2022-10-14

## 2022-10-11 MED ORDER — LIDOCAINE 5 % EX PTCH: 1.0000 | MEDICATED_PATCH | Freq: Two times a day (BID) | CUTANEOUS | 0 refills | Status: AC | PRN

## 2022-10-11 MED ORDER — CYCLOBENZAPRINE HCL 10 MG PO TABS
5.0000 mg | ORAL_TABLET | Freq: Once | ORAL | Status: AC
  Administered 2022-10-11: 5 mg via ORAL
  Filled 2022-10-11: qty 1

## 2022-10-11 MED ORDER — KETOROLAC TROMETHAMINE 30 MG/ML IJ SOLN
30.0000 mg | Freq: Once | INTRAMUSCULAR | Status: AC
  Administered 2022-10-11: 30 mg via INTRAMUSCULAR
  Filled 2022-10-11: qty 1

## 2022-10-11 MED ORDER — MELOXICAM 15 MG PO TABS: 15.0000 mg | ORAL_TABLET | Freq: Every day | ORAL | 0 refills | Status: AC

## 2022-10-11 MED ORDER — CYCLOBENZAPRINE HCL 5 MG PO TABS: 5.0000 mg | ORAL_TABLET | Freq: Three times a day (TID) | ORAL | 0 refills | Status: DC | PRN

## 2022-10-11 MED ORDER — HYDROCODONE-ACETAMINOPHEN 5-325 MG PO TABS
1.0000 | ORAL_TABLET | Freq: Once | ORAL | Status: AC
  Administered 2022-10-11: 1 via ORAL
  Filled 2022-10-11: qty 1

## 2022-10-11 NOTE — ED Provider Notes (Signed)
[facility] Emergency Department Provider Note     Event Date/Time   First MD Initiated Contact with Patient 10/11/22 1909     (approximate)   History   Back Pain   HPI  Makayla Booker is a 42 y.o. female with a history of hypertension, presents to the ED for evaluation of acute low back pain.  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  She presents to the ED for evaluation of her symptoms.  She denies any bladder or bowel incontinence, foot drop, or saddle anesthesia.  She endorses a history of left-sided sciatic nerve irritation. She has been on gabapentin, but only takes it as needed.   Physical Exam   Triage Vital Signs: ED Triage Vitals  Enc Vitals Group     BP 10/11/22 1743 (!) 162/97     Pulse Rate 10/11/22 1742 77     Resp 10/11/22 1742 18     Temp 10/11/22 1742 98.1 F (36.7 C)     Temp Source 10/11/22 1742 Oral     SpO2 10/11/22 1742 98 %     Weight 10/11/22 1742 282 lb (127.9 kg)     Height --      Head Circumference --      Peak Flow --      Pain Score 10/11/22 1742 10     Pain Loc --      Pain Edu? --      Excl. in [place]? --     Most recent vital signs: Vitals:   10/11/22 1742 10/11/22 1743  BP:  (!) 162/97  Pulse: 77   Resp: 18   Temp: 98.1 F (36.7 C)   SpO2: 98%     General Awake, no distress. NAD CV:  Good peripheral perfusion.  RESP:  Normal effort.  ABD:  No distention.  MSK:  Normal spinal alignment without midline tenderness, spasm, deformity, or step-off.  Patient tenderness to palpation to the bilateral lumbosacral paraspinal musculature.  Normal transition from supine to sit.  Normal gait without ataxia. NEURO: Cranial nerves II to XII grossly intact.  Normal LE DTRs bilaterally.   ED Results / Procedures / Treatments   Labs (all labs ordered are listed, but only abnormal results are displayed) Labs Reviewed - No data to  display   EKG   RADIOLOGY  No results found.   PROCEDURES:  Critical Care performed: No  Procedures   MEDICATIONS ORDERED IN ED: Medications  lidocaine (LIDODERM) 5 % 1 patch (1 patch Transdermal Patch Applied 10/11/22 2029)  ketorolac (TORADOL) 30 MG/ML injection 30 mg (30 mg Intramuscular Given 10/11/22 2029)  cyclobenzaprine (FLEXERIL) tablet 5 mg (5 mg Oral Given 10/11/22 2029)  HYDROcodone-acetaminophen (NORCO/VICODIN) 5-325 MG per tablet 1 tablet (1 tablet Oral Given 10/11/22 2029)     IMPRESSION / MDM / ASSESSMENT AND PLAN / ED COURSE  I reviewed the triage vital signs and the nursing notes.                              Differential diagnosis includes, but is not limited to, lumbar strain, acute on chronic low back pain, sciatica  Patient's presentation is most consistent with acute, uncomplicated illness.  Patient to the ED for evaluation of acute lumbar strain following mechanical injury.  No red flags on exam.  Patient with ongoing acute on chronic  L LE sciatica.  Patient's diagnosis is consistent with lumbar strain. Patient will be discharged home with prescriptions for Norco (#9), Flexeril, Lidoderm patches, and meloxicam. Patient is to follow up with primary provider as needed or otherwise directed. Patient is given ED precautions to return to the ED for any worsening or new symptoms.  FINAL CLINICAL IMPRESSION(S) / ED DIAGNOSES   Final diagnoses:  Chronic midline low back pain with left-sided sciatica     Rx / DC Orders   ED Discharge Orders          Ordered    cyclobenzaprine (FLEXERIL) 5 MG tablet  3 times daily PRN        10/11/22 2023    HYDROcodone-acetaminophen (NORCO) 5-325 MG tablet  3 times daily PRN        10/11/22 2023    lidocaine (LIDODERM) 5 %  Every 12 hours PRN        10/11/22 2023    meloxicam (MOBIC) 15 MG tablet  Daily        10/11/22 2023             Note:  This document was prepared using Dragon voice recognition software  and may include unintentional dictation errors.    Melvenia Needles, PA-C 10/11/22 2040    Arta Silence, MD 10/12/22 0630

## 2022-10-11 NOTE — ED Triage Notes (Signed)
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

## 2022-10-11 NOTE — ED Notes (Signed)
Pt to ED for acute exacerbation of chronic back pain. Pt reports hearing a pop when she bent down at work at 1 pm.

## 2022-10-11 NOTE — Discharge Instructions (Addendum)
Your exam is consistent with exacerbation of your chronic low back pain and sciatica.  She also has evidence of lumbar strain.  Take prescription meds as directed.  Follow-up with primary provider for ongoing symptoms.

## 2022-12-11 ENCOUNTER — Ambulatory Visit: Payer: Self-pay

## 2022-12-17 ENCOUNTER — Ambulatory Visit: Payer: Self-pay

## 2023-01-22 NOTE — Progress Notes (Signed)
No show for appointment. Office will call to reschedule.  

## 2023-01-23 ENCOUNTER — Ambulatory Visit: Payer: Self-pay | Admitting: Internal Medicine

## 2023-01-23 DIAGNOSIS — Z91199 Patient's noncompliance with other medical treatment and regimen due to unspecified reason: Secondary | ICD-10-CM

## 2023-02-11 ENCOUNTER — Ambulatory Visit: Payer: Self-pay

## 2023-03-06 ENCOUNTER — Ambulatory Visit: Payer: Self-pay

## 2023-06-29 ENCOUNTER — Emergency Department
Admission: EM | Admit: 2023-06-29 | Discharge: 2023-06-29 | Disposition: A | Discharge status: Home / Self Care | Payer: Self-pay | Attending: Emergency Medicine | Admitting: Emergency Medicine

## 2023-06-29 ENCOUNTER — Other Ambulatory Visit: Payer: Self-pay

## 2023-06-29 DIAGNOSIS — I10 Essential (primary) hypertension: Secondary | ICD-10-CM | POA: Insufficient documentation

## 2023-06-29 DIAGNOSIS — R42 Dizziness and giddiness: Secondary | ICD-10-CM | POA: Diagnosis not present

## 2023-06-29 LAB — CBC WITH DIFFERENTIAL/PLATELET
Abs Immature Granulocytes: 0.02 10*3/uL (ref 0.00–0.07)
Basophils Absolute: 0.1 10*3/uL (ref 0.0–0.1)
Basophils Relative: 1 %
Eosinophils Absolute: 0.2 10*3/uL (ref 0.0–0.5)
Eosinophils Relative: 2 %
HCT: 38.4 % (ref 36.0–46.0)
Hemoglobin: 12.7 g/dL (ref 12.0–15.0)
Immature Granulocytes: 0 %
Lymphocytes Relative: 21 %
Lymphs Abs: 1.8 10*3/uL (ref 0.7–4.0)
MCH: 31.4 pg (ref 26.0–34.0)
MCHC: 33.1 g/dL (ref 30.0–36.0)
MCV: 94.8 fL (ref 80.0–100.0)
Monocytes Absolute: 0.6 10*3/uL (ref 0.1–1.0)
Monocytes Relative: 7 %
Neutro Abs: 6.1 10*3/uL (ref 1.7–7.7)
Neutrophils Relative %: 69 %
Platelets: 420 10*3/uL — ABNORMAL HIGH (ref 150–400)
RBC: 4.05 MIL/uL (ref 3.87–5.11)
RDW: 12.8 % (ref 11.5–15.5)
WBC: 8.8 10*3/uL (ref 4.0–10.5)
nRBC: 0 % (ref 0.0–0.2)

## 2023-06-29 LAB — BASIC METABOLIC PANEL
Anion gap: 11 (ref 5–15)
BUN: 18 mg/dL (ref 6–20)
CO2: 23 mmol/L (ref 22–32)
Calcium: 9.3 mg/dL (ref 8.9–10.3)
Chloride: 103 mmol/L (ref 98–111)
Creatinine, Ser: 0.69 mg/dL (ref 0.44–1.00)
GFR, Estimated: >60 mL/min (ref >60)
Glucose, Bld: 128 mg/dL — ABNORMAL HIGH (ref 70–99)
Potassium: 3.6 mmol/L (ref 3.5–5.1)
Sodium: 137 mmol/L (ref 135–145)

## 2023-06-29 MED ORDER — LOSARTAN POTASSIUM-HCTZ 100-25 MG PO TABS: 1.0000 | ORAL_TABLET | Freq: Every day | ORAL | 0 refills | Status: DC

## 2023-06-29 MED ORDER — ONDANSETRON 4 MG PO TBDP
4.0000 mg | ORAL_TABLET | Freq: Once | ORAL | Status: AC
  Administered 2023-06-29: 4 mg via ORAL
  Filled 2023-06-29: qty 1

## 2023-06-29 NOTE — Discharge Instructions (Signed)
We have prescribed a month supply of a higher dose of your current medication.  This is Hyzaar or losartan-hydrochlorothiazide.  You previously were on 50-12.5 mg.  The new dose is 100-25 mg.  Stop taking the current medication and start this dose.  Monitor your blood pressure a few times daily and keep a diary.  Follow-up with your primary care provider within the next week.  Return to the ER for new, worsening, or persistent severe blood pressure readings especially over 200 on the top number or 120 on the bottom number, chest pain, difficulty breathing, severe headache, dizziness, or any other new or worsening symptoms that concern you.

## 2023-06-29 NOTE — ED Provider Notes (Signed)
[facility] Provider Note    Event Date/Time   First MD Initiated Contact with Patient 06/29/23 2017     (approximate)   History   Hypertension   HPI  Makayla Booker is a 42 y.o. female with a history of hypertension who presents with concern for elevated blood pressure readings over the last few days.  The patient states that she has been having some lightheadedness and intermittent headaches over the last several days.  However she states that she has a history of chronic migraines and the current headaches are similar.  She checked her blood pressure several times while at work and noted readings as high as 200 systolic and 100 diastolic.  She denies any associated chest pain or shortness of breath.  She has no swelling.  She is on losartan-hydrochlorothiazide 50-12.5mg  and states she is compliant.  I reviewed the past medical records.  The patient's most recent patient encounter was in February with OB/GYN.  She has no recent ED visits or hospital admissions.   Physical Exam   Triage Vital Signs: ED Triage Vitals  Encounter Vitals Group     BP 06/29/23 1554 (!) 170/112     Systolic BP Percentile --      Diastolic BP Percentile --      Pulse Rate 06/29/23 1554 (!) 122     Resp 06/29/23 1554 20     Temp 06/29/23 1554 (!) 97.5 F (36.4 C)     Temp Source 06/29/23 1554 Oral     SpO2 06/29/23 1554 100 %     Weight 06/29/23 1550 (!) 311 lb (141.1 kg)     Height 06/29/23 1550 5\' 8"  (1.727 m)     Head Circumference --      Peak Flow --      Pain Score 06/29/23 1552 0     Pain Loc --      Pain Education --      Exclude from Growth Chart --     Most recent vital signs: Vitals:   06/29/23 2101 06/29/23 2104  BP:    Pulse:    Resp:  18  Temp: 98.3 F (36.8 C) 98.1 F (36.7 C)  SpO2:       General: Awake, no distress.  CV:  Good peripheral perfusion.  Resp:  Normal effort.  Abd:  No distention.  Other:  No peripheral edema.  Motor intact  in all extremities.  EOMI.  Normal speech.   ED Results / Procedures / Treatments   Labs (all labs ordered are listed, but only abnormal results are displayed) Labs Reviewed  BASIC METABOLIC PANEL - Abnormal; Notable for the following components:      Result Value   Glucose, Bld 128 (*)    All other components within normal limits  CBC WITH DIFFERENTIAL/PLATELET - Abnormal; Notable for the following components:   Platelets 420 (*)    All other components within normal limits     EKG   RADIOLOGY   PROCEDURES:  Critical Care performed: No  Procedures   MEDICATIONS ORDERED IN ED: Medications  ondansetron (ZOFRAN-ODT) disintegrating tablet 4 mg (4 mg Oral Given 06/29/23 1856)     IMPRESSION / MDM / ASSESSMENT AND PLAN / ED COURSE  I reviewed the triage vital signs and the nursing notes.  42 year old female with PMH as noted above presents with concern for elevated blood pressure readings as high as 200/100 along with some lightheadedness and nonspecific headaches.  She  has no chest pain or other concerning acute symptoms.  Physical exam is unremarkable.  BP was in the 170s when she arrived but has now gone down to 156/95 without intervention.  Differential diagnosis includes, but is not limited to, chronic hypertension.  There is no evidence of endorgan dysfunction or other hypertensive emergency.  BMP shows normal creatinine.  CBC shows no leukocytosis or edema.  There is no indication for cardiac workup.  Patient's presentation is most consistent with exacerbation of chronic illness.  The patient is on the cardiac monitor to evaluate for evidence of arrhythmia and/or significant heart rate changes.  At this time, the patient is stable for discharge home.  I have prescribed the next dose up of the Hyzaar, 100-50 mg and instructed her to follow-up with her primary care provider.  The patient is in agreement with the plan.  I gave strict return precautions and she expresses  understanding.   FINAL CLINICAL IMPRESSION(S) / ED DIAGNOSES   Final diagnoses:  Hypertension, unspecified type     Rx / DC Orders   ED Discharge Orders          Ordered    losartan-hydrochlorothiazide (HYZAAR) 100-25 MG tablet  Daily        06/29/23 2056             Note:  This document was prepared using Dragon voice recognition software and may include unintentional dictation errors.    Dionne Bucy, MD 06/29/23 2107

## 2023-06-29 NOTE — ED Triage Notes (Signed)
Pt c/o lightheadedness & dizziness since last night. States she checked her BP at work and it was 200/??. Pt is on BP meds and states she did take it this morning. States she has been having headaches for the last week

## 2023-07-17 ENCOUNTER — Other Ambulatory Visit: Payer: Self-pay | Admitting: Physician Assistant

## 2023-07-17 DIAGNOSIS — R600 Localized edema: Secondary | ICD-10-CM

## 2023-08-13 ENCOUNTER — Ambulatory Visit
Admission: RE | Admit: 2023-08-13 | Discharge: 2023-08-13 | Disposition: A | Discharge status: Home / Self Care | Payer: 59 | Attending: Physician Assistant | Admitting: Physician Assistant

## 2023-08-13 ENCOUNTER — Ambulatory Visit
Admission: RE | Admit: 2023-08-13 | Discharge: 2023-08-13 | Disposition: A | Discharge status: Home / Self Care | Payer: 59 | Source: Ambulatory Visit | Attending: Physician Assistant | Admitting: Physician Assistant

## 2023-08-13 ENCOUNTER — Other Ambulatory Visit: Payer: Self-pay | Admitting: Physician Assistant

## 2023-08-13 DIAGNOSIS — I83891 Varicose veins of right lower extremities with other complications: Secondary | ICD-10-CM

## 2023-08-16 IMAGING — CT CT HEAD W/O CM
4 series · 16 of 47 positions shown, 18 images · non-contrast
Comparison: None Available.

CLINICAL DATA: Headache, sudden, severe



[Series 2: head wo · axial · 0.47mm/px · z∈[-158,-28]mm · 7 of 36 slices shown, 9 images]
[im 5/36  brain]
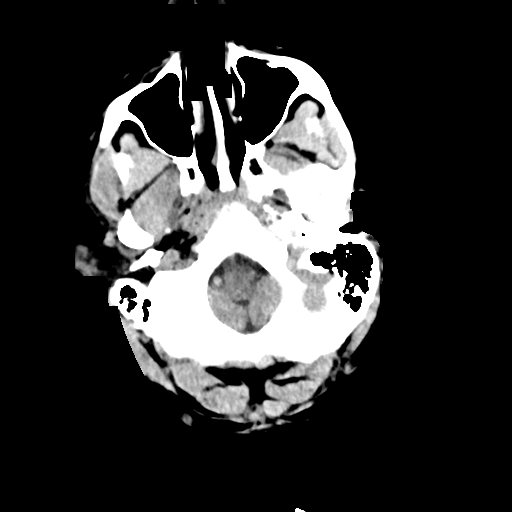
[im 5/36  bone]
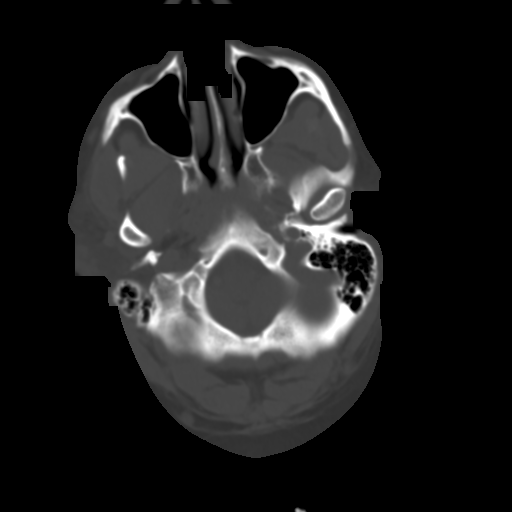
[im 9/36  brain]
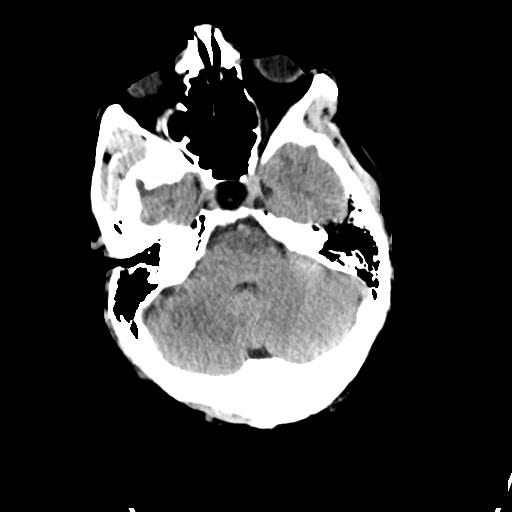
[im 14/36  brain]
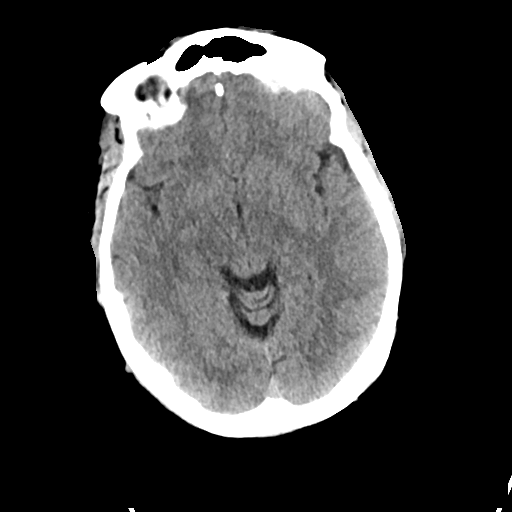
[im 18/36  brain]
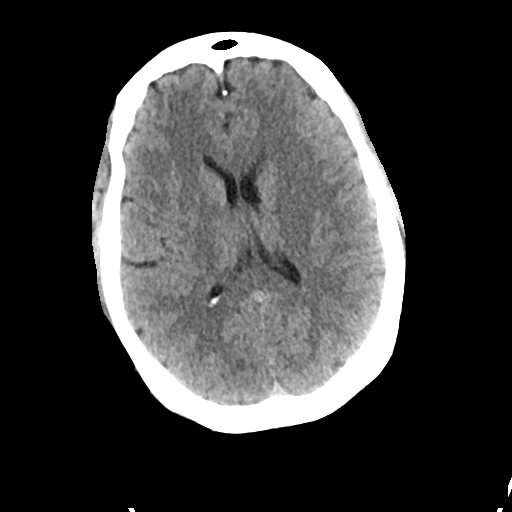
[im 22/36  brain]
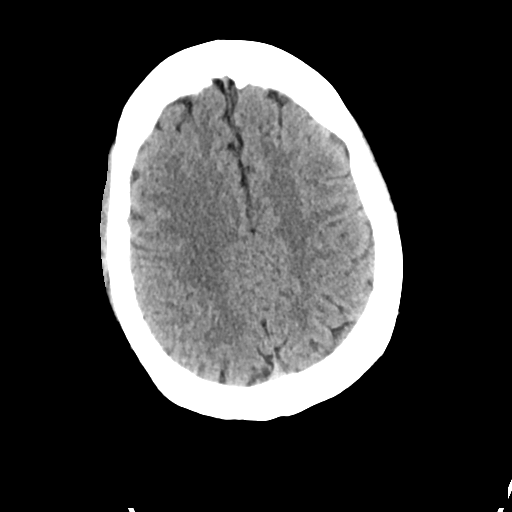
[im 22/36  bone]
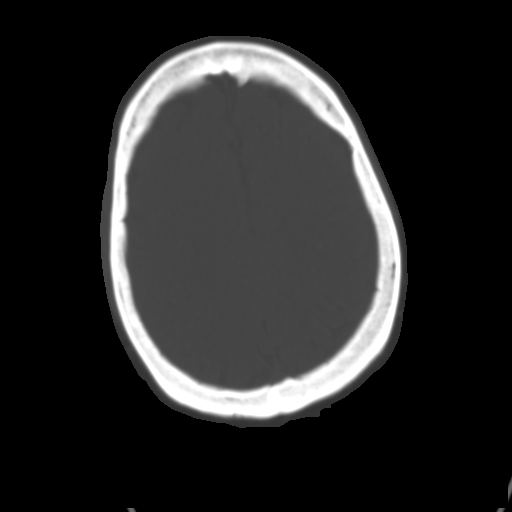
[im 27/36  brain]
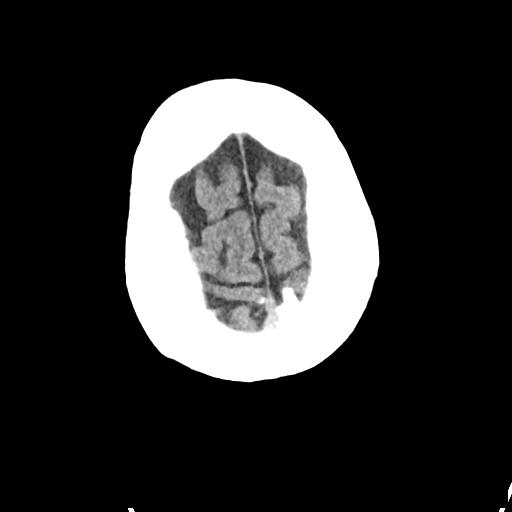
[im 31/36  brain]
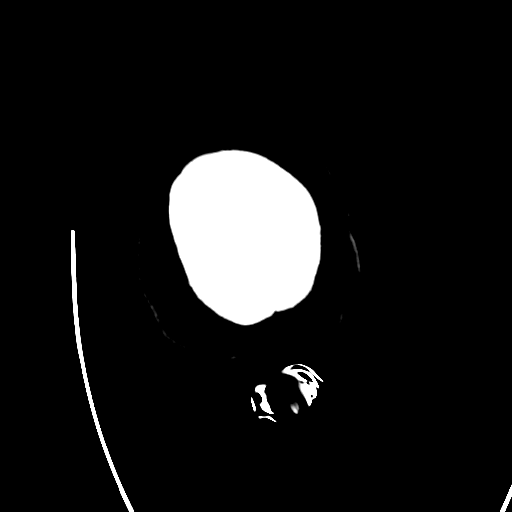

[Series 3: head bone · axial · 0.47mm/px · z∈[-162,-126]mm · 3 of 89 slices shown]
[im 9/89  bone]
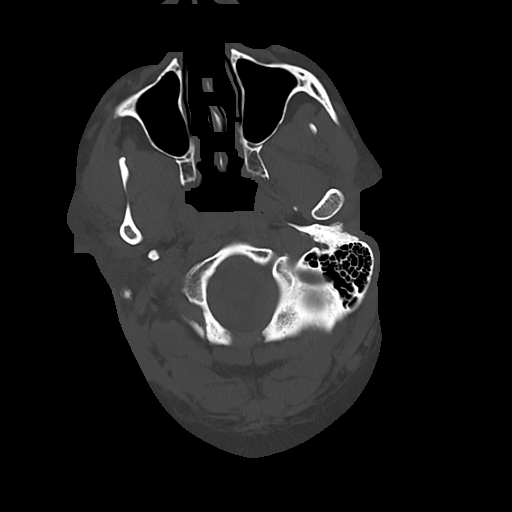
[im 18/89  bone]
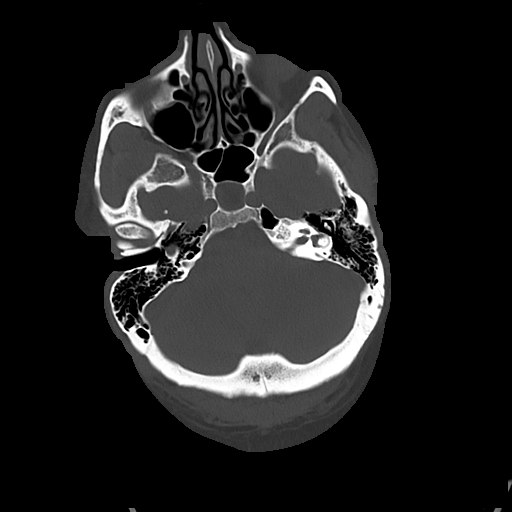
[im 27/89  bone]
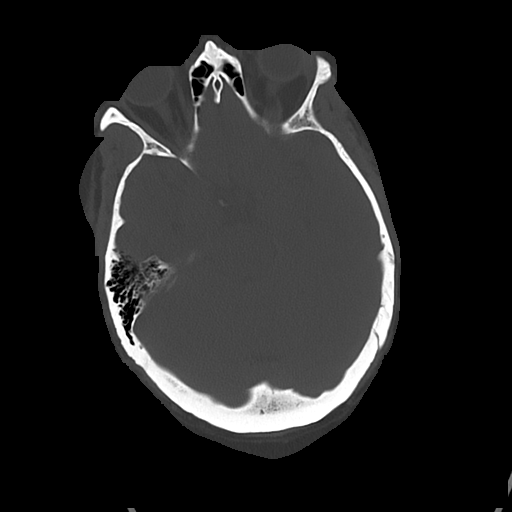

[Series 4: cor soft · coronal · 0.36mm/px · 3 of 84 slices shown]
[im 28/84  brain]
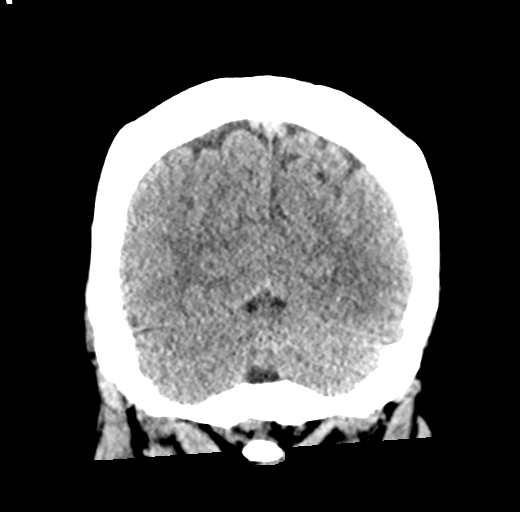
[im 37/84  brain]
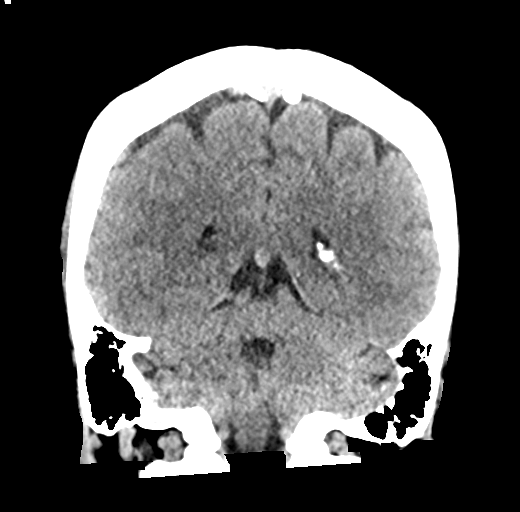
[im 47/84  brain]
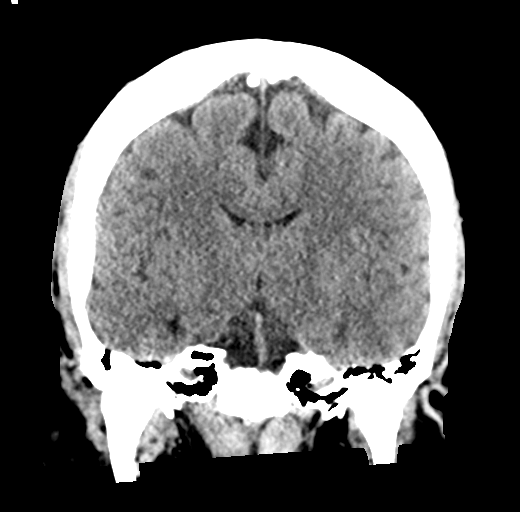

[Series 5: sag soft · sagittal · 0.39mm/px · 3 of 63 slices shown]
[im 21/63  brain]
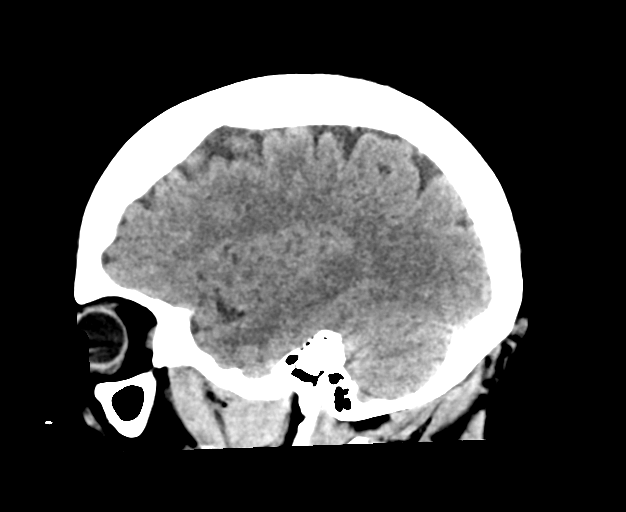
[im 32/63  brain]
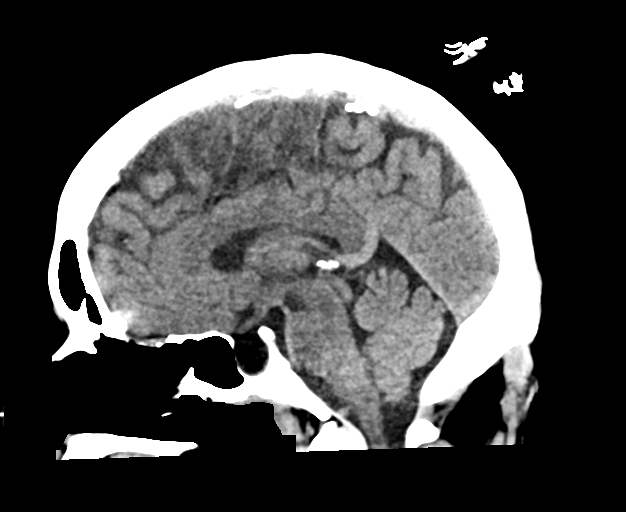
[im 42/63  brain]
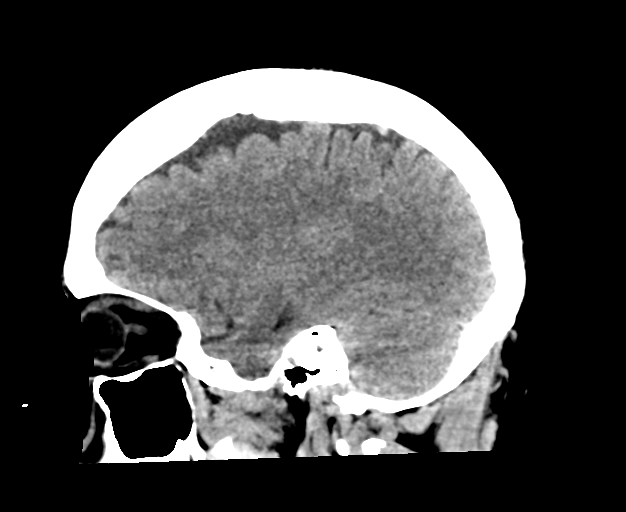

[16 of 47 positions shown; findings below may reference images not displayed]

FINDINGS: Brain: A macroscopic fat containing lesion is seen within the sella,
slightly expanding this in keeping with a sellar dermoid cyst
measuring 15 mm x 16 mm x 18 mm no acute intracranial hemorrhage or
infarct. Ventricular size is normal. Cerebellum is unremarkable.

Vascular: No asymmetric hyperdense vasculature at the skull base

Skull: Normal. Negative for fracture or focal lesion.

Sinuses/Orbits: No acute finding.

Other: Mastoid air cells and middle ear cavities are clear.
IMPRESSION: 18 mm slightly expansile macroscopic fat containing lesion within
the sella most in keeping with a sellar dermoid cyst. This could be
confirmed with contrast enhanced MRI examination

No acute intracranial abnormality.

## 2023-08-22 ENCOUNTER — Ambulatory Visit: Payer: 59

## 2023-10-12 ENCOUNTER — Emergency Department

## 2023-10-12 ENCOUNTER — Emergency Department
Admission: EM | Admit: 2023-10-12 | Discharge: 2023-10-12 | Disposition: A | Discharge status: Home / Self Care | Attending: Emergency Medicine | Admitting: Emergency Medicine

## 2023-10-12 ENCOUNTER — Other Ambulatory Visit: Payer: Self-pay

## 2023-10-12 DIAGNOSIS — M7989 Other specified soft tissue disorders: Secondary | ICD-10-CM | POA: Diagnosis present

## 2023-10-12 DIAGNOSIS — C499 Malignant neoplasm of connective and soft tissue, unspecified: Secondary | ICD-10-CM | POA: Diagnosis not present

## 2023-10-12 LAB — BASIC METABOLIC PANEL
Anion gap: 9 (ref 5–15)
BUN: 13 mg/dL (ref 6–20)
CO2: 23 mmol/L (ref 22–32)
Calcium: 9 mg/dL (ref 8.9–10.3)
Chloride: 101 mmol/L (ref 98–111)
Creatinine, Ser: 0.6 mg/dL (ref 0.44–1.00)
GFR, Estimated: >60 mL/min (ref >60)
Glucose, Bld: 82 mg/dL (ref 70–99)
Potassium: 3.6 mmol/L (ref 3.5–5.1)
Sodium: 133 mmol/L — ABNORMAL LOW (ref 135–145)

## 2023-10-12 LAB — CBC WITH DIFFERENTIAL/PLATELET
Abs Immature Granulocytes: 0.03 10*3/uL (ref 0.00–0.07)
Basophils Absolute: 0.1 10*3/uL (ref 0.0–0.1)
Basophils Relative: 1 %
Eosinophils Absolute: 0.5 10*3/uL (ref 0.0–0.5)
Eosinophils Relative: 6 %
HCT: 37.7 % (ref 36.0–46.0)
Hemoglobin: 12.2 g/dL (ref 12.0–15.0)
Immature Granulocytes: 0 %
Lymphocytes Relative: 24 %
Lymphs Abs: 2.2 10*3/uL (ref 0.7–4.0)
MCH: 31.2 pg (ref 26.0–34.0)
MCHC: 32.4 g/dL (ref 30.0–36.0)
MCV: 96.4 fL (ref 80.0–100.0)
Monocytes Absolute: 0.8 10*3/uL (ref 0.1–1.0)
Monocytes Relative: 9 %
Neutro Abs: 5.7 10*3/uL (ref 1.7–7.7)
Neutrophils Relative %: 60 %
Platelets: 353 10*3/uL (ref 150–400)
RBC: 3.91 MIL/uL (ref 3.87–5.11)
RDW: 13.2 % (ref 11.5–15.5)
WBC: 9.4 10*3/uL (ref 4.0–10.5)
nRBC: 0 % (ref 0.0–0.2)

## 2023-10-12 LAB — BRAIN NATRIURETIC PEPTIDE: B Natriuretic Peptide: 3.2 pg/mL (ref 0.0–100.0)

## 2023-10-12 LAB — CK: Total CK: 107 U/L (ref 38–234)

## 2023-10-12 MED ORDER — ONDANSETRON HCL 4 MG/2ML IJ SOLN
4.0000 mg | Freq: Once | INTRAMUSCULAR | Status: AC
  Administered 2023-10-12: 4 mg via INTRAVENOUS
  Filled 2023-10-12: qty 2

## 2023-10-12 MED ORDER — OXYCODONE HCL 5 MG PO TABS
5.0000 mg | ORAL_TABLET | Freq: Four times a day (QID) | ORAL | 0 refills | Status: DC | PRN
  Filled 2023-10-12: qty 20, 5d supply, fill #0

## 2023-10-12 MED ORDER — FENTANYL CITRATE PF 50 MCG/ML IJ SOSY
50.0000 ug | PREFILLED_SYRINGE | Freq: Once | INTRAMUSCULAR | Status: AC
  Administered 2023-10-12: 50 ug via INTRAVENOUS
  Filled 2023-10-12: qty 1

## 2023-10-12 MED ORDER — OXYCODONE HCL 5 MG PO TABS: 5.0000 mg | ORAL_TABLET | Freq: Four times a day (QID) | ORAL | 0 refills | Status: DC | PRN

## 2023-10-12 MED ORDER — OXYCODONE HCL 5 MG PO TABS
5.0000 mg | ORAL_TABLET | Freq: Once | ORAL | Status: AC
  Administered 2023-10-12: 5 mg via ORAL
  Filled 2023-10-12: qty 1

## 2023-10-12 MED ORDER — IOHEXOL 300 MG/ML  SOLN
100.0000 mL | Freq: Once | INTRAMUSCULAR | Status: AC | PRN
Start: 2023-10-12 — End: 2023-10-12
  Administered 2023-10-12: 100 mL via INTRAVENOUS

## 2023-10-12 NOTE — Discharge Instructions (Addendum)
 We discussed your need to follow-up with oncology team.  They should call you to make an appointment but if you do not hear from them you can call them.  This will need further workup to decide the best treatment plan.  You take oxycodone for pain.  Take MiraLAX capful daily to prevent constipation  IMPRESSION: 1. 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 Possible invasion of the adjacent vastus lateralis and medialis. Abutment of the femoral diaphysis without osseous erosion or remodeling. 2. 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  Take oxycodone as prescribed. Do not drink alcohol, drive or participate in any other potentially dangerous activities while taking this medication as it may make you sleepy. Do not take this medication with any other sedating medications, either prescription or over-the-counter. If you were prescribed Percocet or Vicodin, do not take these with acetaminophen (Tylenol) as it is already contained within these medications.  This medication is an opiate (or narcotic) pain medication and can be habit forming. Use it as little as possible to achieve adequate pain control. Do not use or use it with extreme caution if you have a history of opiate abuse or dependence. If you are on a pain contract with your primary care doctor or a pain specialist, be sure to let them know you were prescribed this medication today from the Emergency Department. This medication is intended for your use only - do not give any to anyone else and keep it in a secure place where nobody else, especially children, have access to it.

## 2023-10-12 NOTE — ED Provider Notes (Signed)
 [facility] Provider Note    Event Date/Time   First MD Initiated Contact with Patient 10/12/23 1726     (approximate)   History   Leg Swelling (X 3 months)   HPI  Makayla Booker is a 43 y.o. female who comes in with swelling to the right leg over the past several months.  Patient reports having 3 months of right upper leg swelling.  She reports having 2 ultrasounds done to rule out DVT.  She also reports having an x-ray of the bone that was reassuring.  She reports that she continues to have increasing pain in this area and that she wanted to have a CT scan done to evaluate it further.  She denies any known fevers.  Denies any history of lymphedema or anything like this happening before.  She reports when taking medications for blood pressure.   Physical Exam   Triage Vital Signs: ED Triage Vitals  Encounter Vitals Group     BP 10/12/23 1703 (!) 158/112     Systolic BP Percentile --      Diastolic BP Percentile --      Pulse Rate 10/12/23 1703 90     Resp 10/12/23 1703 16     Temp 10/12/23 1703 98 F (36.7 C)     Temp Source 10/12/23 1703 Oral     SpO2 10/12/23 1703 100 %     Weight --      Height 10/12/23 1704 5\' 8"  (1.727 m)     Head Circumference --      Peak Flow --      Pain Score 10/12/23 1703 10     Pain Loc --      Pain Education --      Exclude from Growth Chart --     Most recent vital signs: Vitals:   10/12/23 1703  BP: (!) 158/112  Pulse: 90  Resp: 16  Temp: 98 F (36.7 C)  SpO2: 100%     General: Awake, no distress.  CV:  Good peripheral perfusion.  Resp:  Normal effort.  Abd:  No distention.  Other:  Good distal pulse full range of motion of the ankle.  No tenderness on the calf.  No swelling of the tibia-fibula area.  She only has swelling and pain at the thigh area.  It feels tense, warm to touch.   ED Results / Procedures / Treatments   Labs (all labs ordered are listed, but only abnormal results are  displayed) Labs Reviewed  CBC WITH DIFFERENTIAL/PLATELET  BASIC METABOLIC PANEL  BRAIN NATRIURETIC PEPTIDE     RADIOLOGY I have reviewed the CT personally and interpreted + large mass noted    PROCEDURES:  Critical Care performed: No  Procedures   MEDICATIONS ORDERED IN ED: Medications - No data to display   IMPRESSION / MDM / ASSESSMENT AND PLAN / ED COURSE  I reviewed the triage vital signs and the nursing notes.   Patient's presentation is most consistent with acute presentation with potential threat to life or bodily function.   Patient comes in with increasing thigh swelling.  She denies any pain or swelling in the lower leg at all between the knee and the hip.  Will get CT imaging to evaluate for any hematoma, abscess, mass.  CK was normal CBC reassuring BMP reassuring BNP normal   IMPRESSION: 1. 15.9 x 18.1 x 28.8 cm largely lipomatous mass centered within the vastus intermedius muscle in keeping with an  intramuscular liposarcoma. Possible invasion of the adjacent vastus lateralis and medialis. Abutment of the femoral diaphysis without osseous erosion or remodeling. 2. 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    Pt is neurovascularly intact.  She is able to lift the leg up off the bed.  She denies any numbness.  She states that she is still able to ambulate will prescribe some oxycodone to help with pain.  Discussed the case with Dr. Donneta Romberg from oncology who stated the patient can follow-up with our office outpatient this week to discuss further workup and treatment.  She may need to see a higher level of oncology care but he stated that he would start first and then refer out.  Discussed with patient and she also feels comfortable with outpatient follow-up given this been going on for 3 months it has been this large.      FINAL CLINICAL IMPRESSION(S) / ED DIAGNOSES   Final diagnoses:  Liposarcoma (HCC)     Rx / DC  Orders   ED Discharge Orders          Ordered    oxyCODONE (ROXICODONE) 5 MG immediate release tablet  Every 6 hours PRN        10/12/23 2007             Note:  This document was prepared using Dragon voice recognition software and may include unintentional dictation errors.   Concha Se, MD 10/12/23 2007

## 2023-10-12 NOTE — ED Triage Notes (Addendum)
 Pt to ed from home via POV for swelling to the right leg x several months. Per pt her PCP told her she probably has lymphedema in the leg. She has been worked up for blood clots and labs and asked for a CT scan of her right leg. Pt is caox4, in no acute distress in triage. She feels like that same leg is now becoming more weak.

## 2023-10-13 ENCOUNTER — Other Ambulatory Visit: Payer: Self-pay

## 2023-10-14 ENCOUNTER — Telehealth: Payer: Self-pay | Admitting: Internal Medicine

## 2023-10-14 NOTE — Telephone Encounter (Signed)
 Called patient to let her know appointment scheduled for day and time requested by provider- left voicemail.

## 2023-10-15 ENCOUNTER — Inpatient Hospital Stay

## 2023-10-15 ENCOUNTER — Inpatient Hospital Stay: Attending: Internal Medicine | Admitting: Internal Medicine

## 2023-10-15 VITALS — BP 139/90 | HR 88 | Temp 97.9°F | Resp 20 | Ht 68.0 in | Wt 303.6 lb

## 2023-10-15 DIAGNOSIS — M7989 Other specified soft tissue disorders: Secondary | ICD-10-CM | POA: Diagnosis present

## 2023-10-15 DIAGNOSIS — R2241 Localized swelling, mass and lump, right lower limb: Secondary | ICD-10-CM | POA: Diagnosis present

## 2023-10-15 DIAGNOSIS — I10 Essential (primary) hypertension: Secondary | ICD-10-CM | POA: Diagnosis not present

## 2023-10-15 DIAGNOSIS — R200 Anesthesia of skin: Secondary | ICD-10-CM | POA: Diagnosis not present

## 2023-10-15 DIAGNOSIS — R531 Weakness: Secondary | ICD-10-CM | POA: Insufficient documentation

## 2023-10-15 MED ORDER — OXYCODONE-ACETAMINOPHEN 10-325 MG PO TABS: 1.0000 | ORAL_TABLET | Freq: Four times a day (QID) | ORAL | 0 refills | Status: DC | PRN

## 2023-10-15 NOTE — Progress Notes (Signed)
 [facility] CONSULT NOTE  Patient Care Team: Inc, [facility] as PCP - General Donneta Romberg, Worthy Flank, MD as Consulting Physician (Oncology)  CHIEF COMPLAINTS/PURPOSE OF CONSULTATION: Abnormal scans.   Oncology History   No history exists.    HISTORY OF PRESENTING ILLNESS: Patient ambulating-independently. Alone  Makayla Booker 43 y.o.  female with a history of hypertension was recently evaluated in the emergency room for worsening leg pain/leg swelling-noted to have an abnormal mass in the right thigh.   Patient notes to have swelling of the right leg for the last many months or one year.   However, in the last 3 months noted to have right upper leg swelling. Patient fall because of weakness/ numbness of right LE.    Previous Ultrasounds negative for DVT.  Patient denies any prior history of cancers. ? Grandma with cancer  A CT scan done in the emergency room showed concern for liposarcoma.  Patient is here for oncology evaluation.  Review of Systems  Constitutional:  Negative for chills, diaphoresis, fever, malaise/fatigue and weight loss.  HENT:  Negative for nosebleeds and sore throat.   Eyes:  Negative for double vision.  Respiratory:  Negative for cough, hemoptysis, sputum production, shortness of breath and wheezing.   Cardiovascular:  Negative for chest pain, palpitations, orthopnea and leg swelling.  Gastrointestinal:  Negative for abdominal pain, blood in stool, constipation, diarrhea, heartburn, melena, nausea and vomiting.  Genitourinary:  Negative for dysuria, frequency and urgency.  Musculoskeletal:  Positive for myalgias. Negative for back pain and joint pain.  Skin: Negative.  Negative for itching and rash.  Neurological:  Negative for dizziness, tingling, focal weakness, weakness and headaches.  Endo/Heme/Allergies:  Does not bruise/bleed easily.  Psychiatric/Behavioral:  Negative for depression. The patient is not nervous/anxious and  does not have insomnia.     MEDICAL HISTORY:  Past Medical History:  Diagnosis Date   Depression    Per client report.   History of abnormal cervical Pap smear    History of anemia    History of urinary tract infection    Hypertension    Hypertension    Migraines     SURGICAL HISTORY: No past surgical history on file.  SOCIAL HISTORY: Social History   Socioeconomic History   Marital status: Single    Spouse name: Not on file   Number of children: 1   Years of education: 9 (working GED)   Highest education level: 9th grade  Occupational History   Not on file  Tobacco Use   Smoking status: Never   Smokeless tobacco: Never  Vaping Use   Vaping status: Never Used  Substance and Sexual Activity   Alcohol use: Yes    Comment: Socially   Drug use: Never   Sexual activity: Not Currently    Partners: Male    Birth control/protection: Injection    Comment: last sex 2-3 months ago  Other Topics Concern   Not on file  Social History Narrative   Not on file   Social Drivers of Health   Financial Resource Strain: Low Risk  (09/03/2023)   Received from [facility]   Overall Financial Resource Strain (CARDIA)    Difficulty of Paying Living Expenses: Not hard at all  Food Insecurity: No Food Insecurity (10/15/2023)   Hunger Vital Sign    Worried About Running Out of Food in the Last Year: Never true    Ran Out of Food in the Last Year: Never true  Transportation Needs: No Transportation Needs (10/15/2023)   PRAPARE - Administrator, Civil Service (Medical): No    Lack of Transportation (Non-Medical): No  Physical Activity: Not on file  Stress: Stress Concern Present (08/27/2018)   [employer] of Occupational Health - Occupational Stress Questionnaire    Feeling of Stress : To some extent  Social Connections: Not on file  Intimate Partner Violence: Not At Risk (01/11/2022)   Humiliation, Afraid, Rape, and Kick questionnaire    Fear of  Current or Ex-Partner: No    Emotionally Abused: No    Physically Abused: No    Sexually Abused: No    FAMILY HISTORY: Family History  Problem Relation Age of Onset   Cancer Maternal Grandmother    Diabetes Mother    Hypertension Mother    Asthma Mother    Heart disease Paternal Aunt    Diabetes Paternal Aunt    Diabetes Father    Hypertension Sister    Thyroid disease Sister    Lupus Sister    Healthy Son     ALLERGIES:  is allergic to penicillins.  MEDICATIONS:  Current Outpatient Medications  Medication Sig Dispense Refill   oxyCODONE-acetaminophen (PERCOCET) 10-325 MG tablet Take 1 tablet by mouth every 6 (six) hours as needed for pain. 45 tablet 0   losartan-hydrochlorothiazide (HYZAAR) 100-25 MG tablet Take 1 tablet by mouth daily. 30 tablet 0   No current facility-administered medications for this visit.    PHYSICAL EXAMINATION:  Vitals:   10/15/23 1349  BP: (!) 139/90  Pulse: 88  Resp: 20  Temp: 97.9 F (36.6 C)  SpO2: 96%   Filed Weights   10/15/23 1349  Weight: (!) 303 lb 9.6 oz (137.7 kg)    Swelling of RIGHT thigh noted.   Physical Exam Vitals and nursing note reviewed.  HENT:     Head: Normocephalic and atraumatic.     Mouth/Throat:     Pharynx: Oropharynx is clear.  Eyes:     Extraocular Movements: Extraocular movements intact.     Pupils: Pupils are equal, round, and reactive to light.  Cardiovascular:     Rate and Rhythm: Normal rate and regular rhythm.  Pulmonary:     Comments: Decreased breath sounds bilaterally.  Abdominal:     Palpations: Abdomen is soft.  Musculoskeletal:        General: Normal range of motion.     Cervical back: Normal range of motion.  Skin:    General: Skin is warm.  Neurological:     General: No focal deficit present.     Mental Status: She is alert and oriented to person, place, and time.  Psychiatric:        Behavior: Behavior normal.        Judgment: Judgment normal.         LABORATORY  DATA:  I have reviewed the data as listed Lab Results  Component Value Date   WBC 9.4 10/12/2023   HGB 12.2 10/12/2023   HCT 37.7 10/12/2023   MCV 96.4 10/12/2023   PLT 353 10/12/2023   Recent Labs    06/29/23 1557 10/12/23 1707  NA 137 133*  K 3.6 3.6  CL 103 101  CO2 23 23  GLUCOSE 128* 82  BUN 18 13  CREATININE 0.69 0.60  CALCIUM 9.3 9.0  GFRNONAA >60 >60    RADIOGRAPHIC STUDIES: I have personally reviewed the radiological images as listed and agreed with the findings in the report. CT FEMUR  RIGHT W CONTRAST Result Date: 10/12/2023 CLINICAL DATA:  Right thigh mass EXAM: CT OF THE LOWER RIGHT EXTREMITY WITH CONTRAST TECHNIQUE: Multidetector CT imaging of the lower right extremity was performed according to the standard protocol following intravenous contrast administration. RADIATION DOSE REDUCTION: This exam was performed according to the departmental dose-optimization program which includes automated exposure control, adjustment of the mA and/or kV according to patient size and/or use of iterative reconstruction technique. CONTRAST:  OMNIPAQUE IOHEXOL 300 MG/ML  SOLN COMPARISON:  None Available. FINDINGS: Bones/Joint/Cartilage Normal alignment. No acute fracture or dislocation. Joint spaces of the right hip are preserved. 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 No lytic or blastic bone lesions. No osseous erosions or abnormal periosteal reaction. Ligaments Suboptimally assessed by CT. 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 The mass appears entirely contained within the vastus intermedius but does abut the anterolateral cortex of the femoral diaphysis and a separating fascial plane is not identified. As noted above, there is no  associated osseous remodeling, erosion, or abnormal periosteal reaction. The vastus medialis and lateralis abut the mass and direct invasion is not excluded. Musculature of the medial and posterior compartments is unremarkable. Tendon structures are unremarkable. Soft tissues No significant additional findings. No pathologic adenopathy identified within the right iliac, operator, and inguinal lymph node groups. IMPRESSION: 1. 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 Possible invasion of the adjacent vastus lateralis and medialis. Abutment of the femoral diaphysis without osseous erosion or remodeling. 2. 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 Electronically Signed   By: Helyn Numbers M.D.   On: 10/12/2023 19:22     Right leg swelling MARCH 9th, 2025- CT scan [ER]15.9 x 18.1 x 28.8 cm largely lipomatous mass centered within the vastus intermedius muscle in keeping with an intramuscular liposarcoma. Possible invasion of the adjacent vastus lateralis and medialis. Abutment of the femoral diaphysis without osseous erosion or remodeling.  # I long discussion the patient regarding the concerns of the mass being sarcoma/malignancy of the soft tissues/muscle.  Recommend MRI of the thigh with plan for contrast ASAP.  Also recommended CT scan of the chest abdomen pelvis to rule out any distant metastatic disease.    # Discussed that sarcomas are rare tumors but aggressive-would recommend multidisciplinary approach-which may also include chemotherapy radiation and definitive surgery.  Also discussed the sequence of the treatments will depend upon upper imaging/biopsy etc.  Patient will need to be referred to a higher center for definitive surgery.  Will plan biopsy after the MRI is done.   The patient check with insurance regarding her network options for orthopedic referral.  She will  call us ASAP regarding her insurance preference.  # PAIN CONTROL: Poorly controlled on oxycodone 5 mg.  Would recommend Percocet 10 every 6 hours as needed.  New prescription sent.  # History of a menstrual cycle-however more recent hemoglobin within normal limits.   Thank you Dr. Fuller Plan for allowing me to participate in the care of your pleasant patient. Please do not hesitate to contact me with questions or concerns in the interim.  # DISPOSITION: # no labs today # MRI Leg ASAP # CT CAP ASAP # Follow up- MD 2-3 days  after the imaging- Dr.B  # I reviewed the blood work- with the patient in detail; also reviewed the imaging independently [as summarized above]; and with the patient in detail. Above plan of care was discussed with patient  in detail.  My contact information was given to the patient.     Earna Coder, MD 10/15/2023 1:58 PM

## 2023-10-15 NOTE — Assessment & Plan Note (Addendum)
 MARCH 9th, 2025- CT scan [ER]15.9 x 18.1 x 28.8 cm largely lipomatous mass centered within the vastus intermedius muscle in keeping with an intramuscular liposarcoma. Possible invasion of the adjacent vastus lateralis and medialis. Abutment of the femoral diaphysis without osseous erosion or remodeling.  # I long discussion the patient regarding the concerns of the mass being sarcoma/malignancy of the soft tissues/muscle.  Recommend MRI of the thigh with plan for contrast ASAP.  Also recommended CT scan of the chest abdomen pelvis to rule out any distant metastatic disease.    # Discussed that sarcomas are rare tumors but aggressive-would recommend multidisciplinary approach-which may also include chemotherapy radiation and definitive surgery.  Also discussed the sequence of the treatments will depend upon upper imaging/biopsy etc.  Patient will need to be referred to a higher center for definitive surgery.  Will plan biopsy after the MRI is done.   The patient check with insurance regarding her network options for orthopedic referral.  She will call us ASAP regarding her insurance preference.  # PAIN CONTROL: Poorly controlled on oxycodone 5 mg.  Would recommend Percocet 10 every 6 hours as needed.  New prescription sent.  # History of a menstrual cycle-however more recent hemoglobin within normal limits.   Thank you Dr. Fuller Plan for allowing me to participate in the care of your pleasant patient. Please do not hesitate to contact me with questions or concerns in the interim.  # DISPOSITION: # no labs today # MRI Leg ASAP # CT CAP ASAP # Follow up- MD 2-3 days after the imaging- Dr.B  # I reviewed the blood work- with the patient in detail; also reviewed the imaging independently [as summarized above]; and with the patient in detail. Above plan of care was discussed with patient  in detail.  My contact information was given to the patient.     Earna Coder, MD 10/15/2023 1:58 PM

## 2023-10-15 NOTE — Progress Notes (Signed)
 C/o right leg pain 8/10. Having some leg numbness. States she has fallen 4 times in the past 3 months due to her leg, no injuries.

## 2023-10-16 ENCOUNTER — Telehealth: Payer: Self-pay | Admitting: Internal Medicine

## 2023-10-16 NOTE — Telephone Encounter (Signed)
 I called patient to check on her insurance preference for referral to [facility] or [facility].  Left a message to call back.  M/A- please follow-up on this with the patient.  She will need a urgent referral

## 2023-10-16 NOTE — Telephone Encounter (Signed)
 Patient would like a call back to provide information for her MRI and what her insurance company is requesting from [place] to proceed.

## 2023-10-17 ENCOUNTER — Telehealth: Payer: Self-pay

## 2023-10-17 NOTE — Telephone Encounter (Signed)
(  Key: [facility]) PA Case ID #: [id number] Rx #: [id number] Approved on March 12 by [employer] NCPDP 2017 Your PA request has been approved.  Effective Date: 10/16/2023 Authorization Expiration Date: 10/15/2024 Drug oxyCODONE-Acetaminophen 10-325MG  tablets

## 2023-10-18 ENCOUNTER — Telehealth: Payer: Self-pay | Admitting: *Deleted

## 2023-10-18 NOTE — Telephone Encounter (Signed)
 Made a phone call to patient. I asked if she had called her insurance to see if [facility] or [facility] is under her coverage. She said that she has not called them yet. I encouraged her to reach out and call our office back.

## 2023-10-21 ENCOUNTER — Ambulatory Visit
Admission: RE | Admit: 2023-10-21 | Discharge: 2023-10-21 | Disposition: A | Discharge status: Home / Self Care | Source: Ambulatory Visit | Attending: Internal Medicine | Admitting: Internal Medicine

## 2023-10-21 ENCOUNTER — Ambulatory Visit: Admission: RE | Admit: 2023-10-21 | Source: Ambulatory Visit

## 2023-10-21 DIAGNOSIS — M7989 Other specified soft tissue disorders: Secondary | ICD-10-CM | POA: Diagnosis present

## 2023-10-21 MED ORDER — IOHEXOL 300 MG/ML  SOLN: 100.0000 mL | Freq: Once | INTRAMUSCULAR | Status: DC | PRN

## 2023-10-21 MED ORDER — IOHEXOL 350 MG/ML SOLN
100.0000 mL | Freq: Once | INTRAVENOUS | Status: AC | PRN
  Administered 2023-10-21: 100 mL via INTRAVENOUS

## 2023-10-22 ENCOUNTER — Telehealth: Payer: Self-pay | Admitting: *Deleted

## 2023-10-22 ENCOUNTER — Telehealth: Payer: Self-pay | Admitting: Internal Medicine

## 2023-10-22 NOTE — Telephone Encounter (Signed)
 Spoke to patient regarding importance of surgical referral to surgery center.  Await MRI of the femur as ordered.  Will also plan biopsy based upon results of the MRI/after discussion with surgery.  Patient will follow-up as planned.

## 2023-10-22 NOTE — Telephone Encounter (Signed)
 Patient called to see if she got her MRI approved. I sent message to Trula Ore and she says it is good to go now. The pt sais that she can come tom or Thursday. I told Britta Mccreedy and she will get it schduled

## 2023-10-23 ENCOUNTER — Telehealth: Payer: Self-pay | Admitting: *Deleted

## 2023-10-23 ENCOUNTER — Ambulatory Visit
Admission: RE | Admit: 2023-10-23 | Discharge: 2023-10-23 | Disposition: A | Discharge status: Home / Self Care | Source: Ambulatory Visit | Attending: Internal Medicine | Admitting: Internal Medicine

## 2023-10-23 DIAGNOSIS — M7989 Other specified soft tissue disorders: Secondary | ICD-10-CM | POA: Insufficient documentation

## 2023-10-23 MED ORDER — GADOBUTROL 1 MMOL/ML IV SOLN
10.0000 mL | Freq: Once | INTRAVENOUS | Status: AC | PRN
  Administered 2023-10-23: 10 mL via INTRAVENOUS

## 2023-10-23 NOTE — Telephone Encounter (Signed)
 Referral faxed to [facility] on 10/22/23 for [facility] with Dr Grayland Jack.

## 2023-10-28 ENCOUNTER — Inpatient Hospital Stay (HOSPITAL_BASED_OUTPATIENT_CLINIC_OR_DEPARTMENT_OTHER): Admitting: Internal Medicine

## 2023-10-28 VITALS — BP 135/92 | HR 86 | Temp 97.2°F | Resp 16 | Ht 68.0 in | Wt 305.4 lb

## 2023-10-28 DIAGNOSIS — M7989 Other specified soft tissue disorders: Secondary | ICD-10-CM

## 2023-10-28 DIAGNOSIS — R2241 Localized swelling, mass and lump, right lower limb: Secondary | ICD-10-CM | POA: Diagnosis not present

## 2023-10-28 DIAGNOSIS — R937 Abnormal findings on diagnostic imaging of other parts of musculoskeletal system: Secondary | ICD-10-CM

## 2023-10-28 NOTE — Progress Notes (Signed)
 CT CAP 10/21/23, MRI FEMUR 10/23/23.  C/o pain rt leg, 5/10.

## 2023-10-28 NOTE — Progress Notes (Signed)
 [facility] CONSULT NOTE  Patient Care Team: Inc, [facility] as PCP - General Donneta Romberg, Worthy Flank, MD as Consulting Physician (Oncology)  CHIEF COMPLAINTS/PURPOSE OF CONSULTATION: Abnormal scans.   Oncology History   No history exists.    HISTORY OF PRESENTING ILLNESS: Patient ambulating-independently. Alone  Makayla Booker 43 y.o.  female with a history of hypertension and an abnormal mass in the right thigh is here to review the results of her MRI thigh and also CT scan chest and pelvis.  Patient continues to have swelling and pain of the right lower extremity.  Patient is taking oxycodone as needed.  Pain is better controlled.  She started to take the oxycodone at work because suspected sleep.  Review of Systems  Constitutional:  Negative for chills, diaphoresis, fever, malaise/fatigue and weight loss.  HENT:  Negative for nosebleeds and sore throat.   Eyes:  Negative for double vision.  Respiratory:  Negative for cough, hemoptysis, sputum production, shortness of breath and wheezing.   Cardiovascular:  Negative for chest pain, palpitations, orthopnea and leg swelling.  Gastrointestinal:  Negative for abdominal pain, blood in stool, constipation, diarrhea, heartburn, melena, nausea and vomiting.  Genitourinary:  Negative for dysuria, frequency and urgency.  Musculoskeletal:  Positive for myalgias. Negative for back pain and joint pain.  Skin: Negative.  Negative for itching and rash.  Neurological:  Negative for dizziness, tingling, focal weakness, weakness and headaches.  Endo/Heme/Allergies:  Does not bruise/bleed easily.  Psychiatric/Behavioral:  Negative for depression. The patient is not nervous/anxious and does not have insomnia.     MEDICAL HISTORY:  Past Medical History:  Diagnosis Date   Depression    Per client report.   History of abnormal cervical Pap smear    History of anemia    History of urinary tract infection     Hypertension    Hypertension    Migraines     SURGICAL HISTORY: No past surgical history on file.  SOCIAL HISTORY: Social History   Socioeconomic History   Marital status: Single    Spouse name: Not on file   Number of children: 1   Years of education: 9 (working GED)   Highest education level: 9th grade  Occupational History   Not on file  Tobacco Use   Smoking status: Never   Smokeless tobacco: Never  Vaping Use   Vaping status: Never Used  Substance and Sexual Activity   Alcohol use: Yes    Comment: Socially   Drug use: Never   Sexual activity: Not Currently    Partners: Male    Birth control/protection: Injection    Comment: last sex 2-3 months ago  Other Topics Concern   Not on file  Social History Narrative   Not on file   Social Drivers of Health   Financial Resource Strain: Low Risk  (09/03/2023)   Received from [facility]   Overall Financial Resource Strain (CARDIA)    Difficulty of Paying Living Expenses: Not hard at all  Food Insecurity: No Food Insecurity (10/15/2023)   Hunger Vital Sign    Worried About Running Out of Food in the Last Year: Never true    Ran Out of Food in the Last Year: Never true  Transportation Needs: No Transportation Needs (10/15/2023)   PRAPARE - Administrator, Civil Service (Medical): No    Lack of Transportation (Non-Medical): No  Physical Activity: Not on file  Stress: Stress Concern Present (08/27/2018)  [employer] of Occupational Health - Occupational Stress Questionnaire    Feeling of Stress : To some extent  Social Connections: Not on file  Intimate Partner Violence: Not At Risk (01/11/2022)   Humiliation, Afraid, Rape, and Kick questionnaire    Fear of Current or Ex-Partner: No    Emotionally Abused: No    Physically Abused: No    Sexually Abused: No    FAMILY HISTORY: Family History  Problem Relation Age of Onset   Cancer Maternal Grandmother    Diabetes Mother     Hypertension Mother    Asthma Mother    Heart disease Paternal Aunt    Diabetes Paternal Aunt    Diabetes Father    Hypertension Sister    Thyroid disease Sister    Lupus Sister    Healthy Son     ALLERGIES:  is allergic to penicillins.  MEDICATIONS:  Current Outpatient Medications  Medication Sig Dispense Refill   oxyCODONE-acetaminophen (PERCOCET) 10-325 MG tablet Take 1 tablet by mouth every 6 (six) hours as needed for pain. 45 tablet 0   losartan-hydrochlorothiazide (HYZAAR) 100-25 MG tablet Take 1 tablet by mouth daily. 30 tablet 0   No current facility-administered medications for this visit.    PHYSICAL EXAMINATION:  Vitals:   10/28/23 1431  BP: (!) 135/92  Pulse: 86  Resp: 16  Temp: (!) 97.2 F (36.2 C)  SpO2: 100%    Filed Weights   10/28/23 1431  Weight: (!) 305 lb 6.4 oz (138.5 kg)     Swelling of RIGHT thigh noted.   Physical Exam Vitals and nursing note reviewed.  HENT:     Head: Normocephalic and atraumatic.     Mouth/Throat:     Pharynx: Oropharynx is clear.  Eyes:     Extraocular Movements: Extraocular movements intact.     Pupils: Pupils are equal, round, and reactive to light.  Cardiovascular:     Rate and Rhythm: Normal rate and regular rhythm.  Pulmonary:     Comments: Decreased breath sounds bilaterally.  Abdominal:     Palpations: Abdomen is soft.  Musculoskeletal:        General: Normal range of motion.     Cervical back: Normal range of motion.  Skin:    General: Skin is warm.  Neurological:     General: No focal deficit present.     Mental Status: She is alert and oriented to person, place, and time.  Psychiatric:        Behavior: Behavior normal.        Judgment: Judgment normal.         LABORATORY DATA:  I have reviewed the data as listed Lab Results  Component Value Date   WBC 9.4 10/12/2023   HGB 12.2 10/12/2023   HCT 37.7 10/12/2023   MCV 96.4 10/12/2023   PLT 353 10/12/2023   Recent Labs     06/29/23 1557 10/12/23 1707  NA 137 133*  K 3.6 3.6  CL 103 101  CO2 23 23  GLUCOSE 128* 82  BUN 18 13  CREATININE 0.69 0.60  CALCIUM 9.3 9.0  GFRNONAA >60 >60    RADIOGRAPHIC STUDIES: I have personally reviewed the radiological images as listed and agreed with the findings in the report. MR FEMUR RIGHT W WO CONTRAST Result Date: 10/26/2023 CLINICAL DATA:  Right thigh mass for 1 year EXAM: MRI OF THE RIGHT FEMUR WITHOUT AND WITH CONTRAST TECHNIQUE: Multiplanar, multisequence MR imaging of the right thigh was performed both  before and after administration of intravenous contrast. CONTRAST:  10mL GADAVIST GADOBUTROL 1 MMOL/ML IV SOLN COMPARISON:  CT 10/12/2023 FINDINGS: Bones/Joint/Cartilage Right femur is intact. No fracture or dislocation. No femoral head avascular necrosis. No marrow replacing bone lesion. No bone marrow edema or periostitis. Small knee joint effusion. Intra-articular structures of the knee are not well assessed on large field-of-view images. No hip joint effusion. Ligaments Intact. Muscles and Tendons Large intramuscular soft tissue mass occupying the majority of the anterior compartment of the right thigh centered in the right vastus lateralis and vastus intermedius muscles measuring 30.2 x 15.8 x 18.6 cm. Mass is primarily hyperintense on T1 weighted imaging with numerous internal septations and hazy areas of T1 hypointense/T2 hyperintense soft tissue signal. There is postcontrast enhancement of the septal and soft tissue components. Mass is confined to the anterior compartment without invasion of the adjacent femur. The posterior and adductor compartment musculature are normal in appearance. No acute tendinous abnormality. Soft tissues No additional mass lesions of the thigh. Largest right inguinal lymph node measures 8 mm short axis. A right external iliac chain lymph node measures 9 mm short axis. No lymphadenopathy by size criteria. IMPRESSION: Large intramuscular soft  tissue mass occupying the majority of the anterior compartment of the right thigh measuring 30.2 x 15.8 x 18.6 cm. 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 Orthopedic oncology consultation is recommended if not already obtained. Electronically Signed   By: Duanne Guess D.O.   On: 10/26/2023 09:32   CT CHEST ABDOMEN PELVIS W CONTRAST Result Date: 10/21/2023 CLINICAL DATA:  Metastatic disease evaluation, concern for right lower extremity liposarcoma. * Tracking Code: BO * EXAM: CT CHEST, ABDOMEN, AND PELVIS WITH CONTRAST TECHNIQUE: Multidetector CT imaging of the chest, abdomen and pelvis was performed following the standard protocol during bolus administration of intravenous contrast. RADIATION DOSE REDUCTION: This exam was performed according to the departmental dose-optimization program which includes automated exposure control, adjustment of the mA and/or kV according to patient size and/or use of iterative reconstruction technique. CONTRAST:  OMNIPAQUE IOHEXOL 350 MG/ML SOLN COMPARISON:  CT femur October 12, 2023 FINDINGS: CT CHEST FINDINGS Cardiovascular: No significant vascular findings. Normal heart size. No pericardial effusion. Mediastinum/Nodes: No enlarged mediastinal, hilar, or axillary lymph nodes. Thyroid gland, trachea, and esophagus demonstrate no significant findings. Lungs/Pleura: No suspicious pulmonary nodules or masses. Musculoskeletal: Mixed lesion in the T4 vertebral body on image 90/5 and 88/6. CT ABDOMEN PELVIS FINDINGS Hepatobiliary: No suspicious hepatic lesion. Gallbladder is unremarkable. No biliary ductal dilation. Pancreas: No pancreatic ductal dilation or evidence of acute inflammation. Spleen: No splenomegaly. Adrenals/Urinary Tract: Bilateral adrenal glands appear normal. No hydronephrosis. Kidneys demonstrate symmetric enhancement. Urinary bladder is unremarkable for degree of distension. Stomach/Bowel: Stomach is unremarkable for degree of  distension. No pathologic dilation of small or large bowel. Nonobstructed appendix. No evidence of acute bowel inflammation. Vascular/Lymphatic: Normal caliber abdominal aorta. Smooth IVC contours. No pathologically enlarged abdominal or pelvic lymph nodes. Reproductive: Uterus and bilateral adnexa are unremarkable. Other: No significant abdominopelvic free fluid. Musculoskeletal: Partially visualized lipomatous lesion in the vastus intermedius muscle better seen on dedicated femur CT October 12, 2023. IMPRESSION: 1. Partially visualized lipomatous lesion in the vastus intermedius muscle better seen on dedicated femur CT October 12, 2023. 2. Mixed lesion in the T4 vertebral body, nonspecific suggest further evaluation with thoracic spine MRI with and without contrast. 3. No evidence of pulmonary or abdominopelvic metastatic disease. Electronically Signed   By: Christell Constant.D.  On: 10/21/2023 15:14   CT FEMUR RIGHT W CONTRAST Result Date: 10/12/2023 CLINICAL DATA:  Right thigh mass EXAM: CT OF THE LOWER RIGHT EXTREMITY WITH CONTRAST TECHNIQUE: Multidetector CT imaging of the lower right extremity was performed according to the standard protocol following intravenous contrast administration. RADIATION DOSE REDUCTION: This exam was performed according to the departmental dose-optimization program which includes automated exposure control, adjustment of the mA and/or kV according to patient size and/or use of iterative reconstruction technique. CONTRAST:  OMNIPAQUE IOHEXOL 300 MG/ML  SOLN COMPARISON:  None Available. FINDINGS: Bones/Joint/Cartilage Normal alignment. No acute fracture or dislocation. Joint spaces of the right hip are preserved. 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 No lytic or blastic bone lesions. No osseous erosions or abnormal periosteal reaction. Ligaments Suboptimally assessed by CT. Muscles and Tendons There is a 15.9 x 18.1 x 28.8 cm largely lipomatous mass  centered within the vastus intermedius muscle demonstrating largely macroscopic fat but with infiltrative regions of soft tissue most in keeping with an intramuscular liposarcoma. The mass appears entirely contained within the vastus intermedius but does abut the anterolateral cortex of the femoral diaphysis and a separating fascial plane is not identified. As noted above, there is no associated osseous remodeling, erosion, or abnormal periosteal reaction. The vastus medialis and lateralis abut the mass and direct invasion is not excluded. Musculature of the medial and posterior compartments is unremarkable. Tendon structures are unremarkable. Soft tissues No significant additional findings. No pathologic adenopathy identified within the right iliac, operator, and inguinal lymph node groups. IMPRESSION: 1. 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 Possible invasion of the adjacent vastus lateralis and medialis. Abutment of the femoral diaphysis without osseous erosion or remodeling. 2. 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 Electronically Signed   By: Helyn Numbers M.D.   On: 10/12/2023 19:22     Right leg swelling MARCH 9th, 2025- CT scan [ER]15.9 x 18.1 x 28.8 cm largely lipomatous mass centered within the vastus intermedius muscle in keeping with an intramuscular liposarcoma. Possible invasion of the adjacent vastus lateralis and medialis. Abutment of the femoral diaphysis without osseous erosion or remodeling.  # CT scan of the chest abdomen pelvis to rule out any distant metastatic disease- except for  Mixed lesion in the T4 vertebral body, nonspecific suggest further evaluation with thoracic spine MRI with and without contrast.  # MRI Thigh with contrast-: MARCH 22 nd, 2025- Large intramuscular soft tissue mass occupying the majority of the anterior compartment of the right  thigh measuring 30.2 x 15.8 x 18.6 cm. 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 Orthopedic oncology consultation pending at [facility] on 4/3 Dr.Brigman. Discussed with Ms.Erickson    # PAIN CONTROL: Continue Percocet 10 every 6 hours as needed-   # History of a menstrual cycle-however more recent hemoglobin within normal limits.   # DISPOSITION: # MRI T spine 1 week or so-   # Follow up- MD  2 weeks- No labs- Dr.B  # I reviewed the blood work- with the patient in detail; also reviewed the imaging independently [as summarized above]; and with the patient in detail.

## 2023-10-28 NOTE — Assessment & Plan Note (Addendum)
 MARCH 9th, 2025- CT scan [ER]15.9 x 18.1 x 28.8 cm largely lipomatous mass centered within the vastus intermedius muscle in keeping with an intramuscular liposarcoma. Possible invasion of the adjacent vastus lateralis and medialis. Abutment of the femoral diaphysis without osseous erosion or remodeling.  # CT scan of the chest abdomen pelvis to rule out any distant metastatic disease- except for  Mixed lesion in the T4 vertebral body, nonspecific suggest further evaluation with thoracic spine MRI with and without contrast.  # MRI Thigh with contrast-: MARCH 22 nd, 2025- Large intramuscular soft tissue mass occupying the majority of the anterior compartment of the right thigh measuring 30.2 x 15.8 x 18.6 cm. 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 Orthopedic oncology consultation pending at [facility] on 4/3 Dr.Brigman. Discussed with Ms.Erickson    # PAIN CONTROL: Continue Percocet 10 every 6 hours as needed-   # History of a menstrual cycle-however more recent hemoglobin within normal limits.   # DISPOSITION: # MRI T spine 1 week or so-   # Follow up- MD  2 weeks- No labs- Dr.B  # I reviewed the blood work- with the patient in detail; also reviewed the imaging independently [as summarized above]; and with the patient in detail.

## 2023-10-28 NOTE — Progress Notes (Signed)
 Patient has an appt on 11/07/23 at [facility] with Dr Teodora Medici.

## 2023-11-04 ENCOUNTER — Ambulatory Visit
Admission: RE | Admit: 2023-11-04 | Discharge: 2023-11-04 | Disposition: A | Discharge status: Home / Self Care | Source: Ambulatory Visit | Attending: Internal Medicine | Admitting: Internal Medicine

## 2023-11-04 DIAGNOSIS — M7989 Other specified soft tissue disorders: Secondary | ICD-10-CM

## 2023-11-04 DIAGNOSIS — R937 Abnormal findings on diagnostic imaging of other parts of musculoskeletal system: Secondary | ICD-10-CM

## 2023-11-04 MED ORDER — GADOBUTROL 1 MMOL/ML IV SOLN
10.0000 mL | Freq: Once | INTRAVENOUS | Status: AC | PRN
  Administered 2023-11-04: 10 mL via INTRAVENOUS

## 2023-11-18 ENCOUNTER — Encounter: Payer: Self-pay | Admitting: *Deleted

## 2023-11-18 ENCOUNTER — Encounter: Payer: Self-pay | Admitting: Internal Medicine

## 2023-11-18 ENCOUNTER — Inpatient Hospital Stay: Attending: Internal Medicine | Admitting: Internal Medicine

## 2023-11-18 ENCOUNTER — Other Ambulatory Visit: Payer: Self-pay | Admitting: Internal Medicine

## 2023-11-18 VITALS — BP 150/100 | HR 98 | Temp 99.0°F | Resp 18 | Ht 68.0 in

## 2023-11-18 DIAGNOSIS — Z79899 Other long term (current) drug therapy: Secondary | ICD-10-CM | POA: Insufficient documentation

## 2023-11-18 DIAGNOSIS — R2241 Localized swelling, mass and lump, right lower limb: Secondary | ICD-10-CM | POA: Diagnosis present

## 2023-11-18 MED ORDER — OXYCODONE-ACETAMINOPHEN 10-325 MG PO TABS
1.0000 | ORAL_TABLET | Freq: Four times a day (QID) | ORAL | 0 refills | Status: DC | PRN
Start: 2023-11-18 — End: 2023-12-16

## 2023-11-18 NOTE — Progress Notes (Signed)
 [facility] CONSULT NOTE  Patient Care Team: Inc, [facility] as PCP - General Donneta Romberg, Worthy Flank, MD as Consulting Physician (Oncology)  CHIEF COMPLAINTS/PURPOSE OF CONSULTATION: Abnormal scans.   Oncology History   No history exists.    HISTORY OF PRESENTING ILLNESS: Patient ambulating-independently. Alone  Makayla Booker 43 y.o.  female with a history of hypertension and an abnormal mass in the right thigh is here for follow-up.  In the interim patient has been evaluated at [facility] orthopedic surgery.   Patient needs a note for her employer stating that she is not on any restrictions for work, so she can go back to work.   On 11/04/2023 she had MRI on thoracic.   She rates her pain at about a 8 today. She needs a refill on her oxycodone.  Patient continues to have swelling and pain of the right lower extremity.  Patient is taking oxycodone as needed.   Review of Systems  Constitutional:  Negative for chills, diaphoresis, fever, malaise/fatigue and weight loss.  HENT:  Negative for nosebleeds and sore throat.   Eyes:  Negative for double vision.  Respiratory:  Negative for cough, hemoptysis, sputum production, shortness of breath and wheezing.   Cardiovascular:  Negative for chest pain, palpitations, orthopnea and leg swelling.  Gastrointestinal:  Negative for abdominal pain, blood in stool, constipation, diarrhea, heartburn, melena, nausea and vomiting.  Genitourinary:  Negative for dysuria, frequency and urgency.  Musculoskeletal:  Positive for myalgias. Negative for back pain and joint pain.  Skin: Negative.  Negative for itching and rash.  Neurological:  Negative for dizziness, tingling, focal weakness, weakness and headaches.  Endo/Heme/Allergies:  Does not bruise/bleed easily.  Psychiatric/Behavioral:  Negative for depression. The patient is not nervous/anxious and does not have insomnia.     MEDICAL HISTORY:  Past Medical History:   Diagnosis Date   Depression    Per client report.   History of abnormal cervical Pap smear    History of anemia    History of urinary tract infection    Hypertension    Hypertension    Migraines     SURGICAL HISTORY: History reviewed. No pertinent surgical history.  SOCIAL HISTORY: Social History   Socioeconomic History   Marital status: Single    Spouse name: Not on file   Number of children: 1   Years of education: 9 (working GED)   Highest education level: 9th grade  Occupational History   Not on file  Tobacco Use   Smoking status: Never   Smokeless tobacco: Never  Vaping Use   Vaping status: Never Used  Substance and Sexual Activity   Alcohol use: Yes    Comment: Socially   Drug use: Never   Sexual activity: Not Currently    Partners: Male    Birth control/protection: Injection    Comment: last sex 2-3 months ago  Other Topics Concern   Not on file  Social History Narrative   Not on file   Social Drivers of Health   Financial Resource Strain: Low Risk  (09/03/2023)   Received from [facility]   Overall Financial Resource Strain (CARDIA)    Difficulty of Paying Living Expenses: Not hard at all  Food Insecurity: No Food Insecurity (10/15/2023)   Hunger Vital Sign    Worried About Running Out of Food in the Last Year: Never true    Ran Out of Food in the Last Year: Never true  Transportation Needs: No Transportation Needs (  10/15/2023)   PRAPARE - Administrator, Civil Service (Medical): No    Lack of Transportation (Non-Medical): No  Physical Activity: Not on file  Stress: Stress Concern Present (08/27/2018)   [employer] of Occupational Health - Occupational Stress Questionnaire    Feeling of Stress : To some extent  Social Connections: Not on file  Intimate Partner Violence: Not At Risk (01/11/2022)   Humiliation, Afraid, Rape, and Kick questionnaire    Fear of Current or Ex-Partner: No    Emotionally Abused: No     Physically Abused: No    Sexually Abused: No    FAMILY HISTORY: Family History  Problem Relation Age of Onset   Cancer Maternal Grandmother    Diabetes Mother    Hypertension Mother    Asthma Mother    Heart disease Paternal Aunt    Diabetes Paternal Aunt    Diabetes Father    Hypertension Sister    Thyroid disease Sister    Lupus Sister    Healthy Son     ALLERGIES:  is allergic to penicillins.  MEDICATIONS:  Current Outpatient Medications  Medication Sig Dispense Refill   losartan-hydrochlorothiazide (HYZAAR) 100-25 MG tablet Take 1 tablet by mouth daily. 30 tablet 0   oxyCODONE-acetaminophen (PERCOCET) 10-325 MG tablet Take 1 tablet by mouth every 6 (six) hours as needed for pain. 60 tablet 0   No current facility-administered medications for this visit.    PHYSICAL EXAMINATION:  Vitals:   11/18/23 1345  BP: (!) 150/100  Pulse: 98  Resp: 18  Temp: 99 F (37.2 C)  SpO2: 99%     There were no vitals filed for this visit.    Swelling of RIGHT thigh noted.   Physical Exam Vitals and nursing note reviewed.  HENT:     Head: Normocephalic and atraumatic.     Mouth/Throat:     Pharynx: Oropharynx is clear.  Eyes:     Extraocular Movements: Extraocular movements intact.     Pupils: Pupils are equal, round, and reactive to light.  Cardiovascular:     Rate and Rhythm: Normal rate and regular rhythm.  Pulmonary:     Comments: Decreased breath sounds bilaterally.  Abdominal:     Palpations: Abdomen is soft.  Musculoskeletal:        General: Normal range of motion.     Cervical back: Normal range of motion.  Skin:    General: Skin is warm.  Neurological:     General: No focal deficit present.     Mental Status: She is alert and oriented to person, place, and time.  Psychiatric:        Behavior: Behavior normal.        Judgment: Judgment normal.         LABORATORY DATA:  I have reviewed the data as listed Lab Results  Component Value Date    WBC 9.4 10/12/2023   HGB 12.2 10/12/2023   HCT 37.7 10/12/2023   MCV 96.4 10/12/2023   PLT 353 10/12/2023   Recent Labs    06/29/23 1557 10/12/23 1707  NA 137 133*  K 3.6 3.6  CL 103 101  CO2 23 23  GLUCOSE 128* 82  BUN 18 13  CREATININE 0.69 0.60  CALCIUM 9.3 9.0  GFRNONAA >60 >60    RADIOGRAPHIC STUDIES: I have personally reviewed the radiological images as listed and agreed with the findings in the report. MR THORACIC SPINE W WO CONTRAST Result Date: 11/18/2023 CLINICAL DATA:  Abnormal lesion noted on CT scan. History of lower extremity sarcoma. T4 abnormality. EXAM: MRI THORACIC WITHOUT AND WITH CONTRAST TECHNIQUE: Multiplanar and multiecho pulse sequences of the thoracic spine were obtained without and with intravenous contrast. CONTRAST:  10mL GADAVIST GADOBUTROL 1 MMOL/ML IV SOLN COMPARISON:  CT chest 10/21/2023 FINDINGS: Alignment:  Normal Vertebrae: No significant thoracic region finding. Irregular sclerotic change within the T4 vertebral body is quite likely benign. No T2 bright component. No enhancing component. Second small sclerotic focus within the posterior left T7 vertebral body, also not likely of any significance. Small T6 hemangioma of no significance. Cord:  Normal Paraspinal and other soft tissues: Negative Disc levels: No disc level finding of significance. Mild hypertrophic change of the posterior ligaments at T11 with mild narrowing of the canal but no neural compression. Small dural calcification posteriorly at T8, not significant. IMPRESSION: 1. Irregular sclerotic change within the T4 vertebral body is quite likely benign. No T2 bright component. No enhancing component. 2. No evidence of regional metastatic disease. Electronically Signed   By: Bettylou Brunner M.D.   On: 11/18/2023 13:53   MR FEMUR RIGHT W WO CONTRAST Result Date: 10/26/2023 CLINICAL DATA:  Right thigh mass for 1 year EXAM: MRI OF THE RIGHT FEMUR WITHOUT AND WITH CONTRAST TECHNIQUE: Multiplanar,  multisequence MR imaging of the right thigh was performed both before and after administration of intravenous contrast. CONTRAST:  10mL GADAVIST GADOBUTROL 1 MMOL/ML IV SOLN COMPARISON:  CT 10/12/2023 FINDINGS: Bones/Joint/Cartilage Right femur is intact. No fracture or dislocation. No femoral head avascular necrosis. No marrow replacing bone lesion. No bone marrow edema or periostitis. Small knee joint effusion. Intra-articular structures of the knee are not well assessed on large field-of-view images. No hip joint effusion. Ligaments Intact. Muscles and Tendons Large intramuscular soft tissue mass occupying the majority of the anterior compartment of the right thigh centered in the right vastus lateralis and vastus intermedius muscles measuring 30.2 x 15.8 x 18.6 cm. Mass is primarily hyperintense on T1 weighted imaging with numerous internal septations and hazy areas of T1 hypointense/T2 hyperintense soft tissue signal. There is postcontrast enhancement of the septal and soft tissue components. Mass is confined to the anterior compartment without invasion of the adjacent femur. The posterior and adductor compartment musculature are normal in appearance. No acute tendinous abnormality. Soft tissues No additional mass lesions of the thigh. Largest right inguinal lymph node measures 8 mm short axis. A right external iliac chain lymph node measures 9 mm short axis. No lymphadenopathy by size criteria. IMPRESSION: Large intramuscular soft tissue mass occupying the majority of the anterior compartment of the right thigh measuring 30.2 x 15.8 x 18.6 cm. 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 Orthopedic oncology consultation is recommended if not already obtained. Electronically Signed   By: Leverne Reading D.O.   On: 10/26/2023 09:32   CT CHEST ABDOMEN PELVIS W CONTRAST Result Date: 10/21/2023 CLINICAL DATA:  Metastatic disease evaluation, concern for right lower extremity liposarcoma. *  Tracking Code: BO * EXAM: CT CHEST, ABDOMEN, AND PELVIS WITH CONTRAST TECHNIQUE: Multidetector CT imaging of the chest, abdomen and pelvis was performed following the standard protocol during bolus administration of intravenous contrast. RADIATION DOSE REDUCTION: This exam was performed according to the departmental dose-optimization program which includes automated exposure control, adjustment of the mA and/or kV according to patient size and/or use of iterative reconstruction technique. CONTRAST:  OMNIPAQUE IOHEXOL 350 MG/ML SOLN COMPARISON:  CT femur October 12, 2023 FINDINGS: CT CHEST FINDINGS  Cardiovascular: No significant vascular findings. Normal heart size. No pericardial effusion. Mediastinum/Nodes: No enlarged mediastinal, hilar, or axillary lymph nodes. Thyroid gland, trachea, and esophagus demonstrate no significant findings. Lungs/Pleura: No suspicious pulmonary nodules or masses. Musculoskeletal: Mixed lesion in the T4 vertebral body on image 90/5 and 88/6. CT ABDOMEN PELVIS FINDINGS Hepatobiliary: No suspicious hepatic lesion. Gallbladder is unremarkable. No biliary ductal dilation. Pancreas: No pancreatic ductal dilation or evidence of acute inflammation. Spleen: No splenomegaly. Adrenals/Urinary Tract: Bilateral adrenal glands appear normal. No hydronephrosis. Kidneys demonstrate symmetric enhancement. Urinary bladder is unremarkable for degree of distension. Stomach/Bowel: Stomach is unremarkable for degree of distension. No pathologic dilation of small or large bowel. Nonobstructed appendix. No evidence of acute bowel inflammation. Vascular/Lymphatic: Normal caliber abdominal aorta. Smooth IVC contours. No pathologically enlarged abdominal or pelvic lymph nodes. Reproductive: Uterus and bilateral adnexa are unremarkable. Other: No significant abdominopelvic free fluid. Musculoskeletal: Partially visualized lipomatous lesion in the vastus intermedius muscle better seen on dedicated femur CT  October 12, 2023. IMPRESSION: 1. Partially visualized lipomatous lesion in the vastus intermedius muscle better seen on dedicated femur CT October 12, 2023. 2. Mixed lesion in the T4 vertebral body, nonspecific suggest further evaluation with thoracic spine MRI with and without contrast. 3. No evidence of pulmonary or abdominopelvic metastatic disease. Electronically Signed   By: Tama Fails M.D.   On: 10/21/2023 15:14     Leg mass, right # MARCH 9th, 2025- CT scan [ER]15.9 x 18.1 x 28.8 cm largely lipomatous mass centered within the vastus intermedius muscle in keeping with an intramuscular liposarcoma. Possible invasion of the adjacent vastus lateralis and medialis. Abutment of the femoral diaphysis without osseous erosion or remodeling. # CT scan of the chest abdomen pelvis to rule out any distant metastatic disease- except for  Mixed lesion in the T4 vertebral body, nonspecific suggest further evaluation with thoracic spine MRI with and without contrast.-APRIL 14th, 2025- Irregular sclerotic change within the T4 vertebral body is quite likely benign. No T2 bright component. No enhancing component. No evidence of regional metastatic disease.   # MRI Thigh with contrast-: MARCH 22 nd, 2025- Large intramuscular soft tissue mass occupying the majority of the anterior compartment of the right thigh measuring 30.2 x 15.8 x 18.6 cm--based on imaging differential diagnosis atypical lipoma versus liposarcoma.  No biopsy done.  Discussed with [facility] orthopedic surgery.  Patient currently awaiting surgery in May 30th.  Will review the patient post surgery/pathology. Discussed with Ms.Erickson    # PAIN CONTROL: Continue Percocet 10 every 6 hours as needed- pt will call for further refills.   # History of a menstrual cycle-however more recent hemoglobin within normal limits- stable.     # DISPOSITION: #  letter- NO  restrictions for work, so she can go back to work # follow up in 1 st week of June 2025-; MD  no  labs- Dr.B  # I reviewed the blood work- with the patient in detail; also reviewed the imaging independently [as summarized above]; and with the patient in detail.

## 2023-11-18 NOTE — Assessment & Plan Note (Addendum)
#   MARCH 9th, 2025- CT scan [ER]15.9 x 18.1 x 28.8 cm largely lipomatous mass centered within the vastus intermedius muscle in keeping with an intramuscular liposarcoma. Possible invasion of the adjacent vastus lateralis and medialis. Abutment of the femoral diaphysis without osseous erosion or remodeling. # CT scan of the chest abdomen pelvis to rule out any distant metastatic disease- except for  Mixed lesion in the T4 vertebral body, nonspecific suggest further evaluation with thoracic spine MRI with and without contrast.-APRIL 14th, 2025- Irregular sclerotic change within the T4 vertebral body is quite likely benign. No T2 bright component. No enhancing component. No evidence of regional metastatic disease.   # MRI Thigh with contrast-: MARCH 22 nd, 2025- Large intramuscular soft tissue mass occupying the majority of the anterior compartment of the right thigh measuring 30.2 x 15.8 x 18.6 cm--based on imaging differential diagnosis atypical lipoma versus liposarcoma.  No biopsy done.  Discussed with [facility] orthopedic surgery.  Patient currently awaiting surgery in May 30th.  Will review the patient post surgery/pathology. Discussed with Ms.Erickson    # PAIN CONTROL: Continue Percocet 10 every 6 hours as needed- pt will call for further refills.   # History of a menstrual cycle-however more recent hemoglobin within normal limits- stable.     # DISPOSITION: #  letter- NO  restrictions for work, so she can go back to work # follow up in 1 st week of June 2025-; MD  no labs- Dr.B  # I reviewed the blood work- with the patient in detail; also reviewed the imaging independently [as summarized above]; and with the patient in detail.

## 2023-11-18 NOTE — Telephone Encounter (Signed)
 Message sent to Dr. Geraldene Kleine, she is being seen in office today.

## 2023-11-18 NOTE — Progress Notes (Signed)
 MRI T-spine 11/04/23, called for read.  Refill oxycodone, pended. She states that she ran out of this rx this am, have surgery may 30th, asking for refill until surgery date.

## 2023-11-18 NOTE — Progress Notes (Signed)
 Patient needs a note for her employer stating that she is not on any restrictions for work, so she can go back to work. On 11/04/2023 she had MRI on thoracic. She rates her pain at about a 8 today. She needs a refill on her oxycodone.

## 2023-12-16 ENCOUNTER — Other Ambulatory Visit: Payer: Self-pay | Admitting: Internal Medicine

## 2023-12-18 MED ORDER — OXYCODONE-ACETAMINOPHEN 10-325 MG PO TABS: 1.0000 | ORAL_TABLET | Freq: Four times a day (QID) | ORAL | 0 refills | Status: DC | PRN

## 2024-01-03 HISTORY — PX: OTHER SURGICAL HISTORY: SHX169

## 2024-01-10 ENCOUNTER — Telehealth: Payer: Self-pay | Admitting: Internal Medicine

## 2024-01-10 ENCOUNTER — Inpatient Hospital Stay: Attending: Internal Medicine | Admitting: Internal Medicine

## 2024-01-10 NOTE — Telephone Encounter (Signed)
 Called patient to check on her since she was late for her appointmetn. No answer. Left a voicemail for her to call back to reschedule.

## 2024-03-30 ENCOUNTER — Emergency Department
Admission: EM | Admit: 2024-03-30 | Discharge: 2024-03-30 | Disposition: A | Discharge status: Home / Self Care | Attending: Emergency Medicine | Admitting: Emergency Medicine

## 2024-03-30 ENCOUNTER — Encounter: Payer: Self-pay | Admitting: Emergency Medicine

## 2024-03-30 ENCOUNTER — Other Ambulatory Visit: Payer: Self-pay

## 2024-03-30 DIAGNOSIS — I10 Essential (primary) hypertension: Secondary | ICD-10-CM | POA: Diagnosis not present

## 2024-03-30 DIAGNOSIS — R040 Epistaxis: Secondary | ICD-10-CM | POA: Insufficient documentation

## 2024-03-30 MED ORDER — LOSARTAN POTASSIUM-HCTZ 100-25 MG PO TABS: 1.0000 | ORAL_TABLET | Freq: Every day | ORAL | 2 refills | Status: DC

## 2024-03-30 MED ORDER — OXYMETAZOLINE HCL 0.05 % NA SOLN
1.0000 | Freq: Once | NASAL | Status: AC
  Administered 2024-03-30: 1 via NASAL
  Filled 2024-03-30: qty 30

## 2024-03-30 NOTE — ED Notes (Signed)
 Bleeding controlled at this time. Pt A&Ox4.

## 2024-03-30 NOTE — ED Provider Notes (Signed)
 [facility] Provider Note    Event Date/Time   First MD Initiated Contact with Patient 03/30/24 0207     (approximate)   History   Epistaxis   HPI  Makayla Booker is a 43 year old female presenting to the emergency department for evaluation of epistaxis.  On Sunday evening, patient was at work when she noticed that she had a runny nose.  A little while later, went to wipe her nose and noticed that she was having a small amount of bleeding.  Later had more persistent bleeding primarily from her left nares, did also note some blood from her right naris.  Not on anticoagulation.  Bleeding controlled at this time.     Physical Exam   Triage Vital Signs: ED Triage Vitals  Encounter Vitals Group     BP 03/30/24 0118 (!) 176/100     Girls Systolic BP Percentile --      Girls Diastolic BP Percentile --      Boys Systolic BP Percentile --      Boys Diastolic BP Percentile --      Pulse Rate 03/30/24 0118 86     Resp 03/30/24 0118 16     Temp 03/30/24 0118 98.1 F (36.7 C)     Temp Source 03/30/24 0118 Oral     SpO2 03/30/24 0118 97 %     Weight 03/30/24 0117 280 lb (127 kg)     Height 03/30/24 0117 5' 8 (1.727 m)     Head Circumference --      Peak Flow --      Pain Score 03/30/24 0117 0     Pain Loc --      Pain Education --      Exclude from Growth Chart --     Most recent vital signs: Vitals:   03/30/24 0118  BP: (!) 176/100  Pulse: 86  Resp: 16  Temp: 98.1 F (36.7 C)  SpO2: 97%     General: Awake, interactive  HEENT: There is a small amount of dried blood within the left naris, no visible blood in the right nares.  No visible blood in the back of the throat. CV:  Regular rate, good peripheral perfusion.  Resp:  Unlabored respirations.  Abd:  Nondistended.  Neuro:  Symmetric facial movement, fluid speech   ED Results / Procedures / Treatments   Labs (all labs ordered are listed, but only abnormal results are displayed) Labs  Reviewed - No data to display   EKG EKG independently reviewed and interpreted by myself demonstrates:    RADIOLOGY Imaging independently reviewed and interpreted by myself demonstrates:   Formal Radiology Read:  No results found.  PROCEDURES:  Critical Care performed: No  Procedures   MEDICATIONS ORDERED IN ED: Medications  oxymetazoline  (AFRIN) 0.05 % nasal spray 1 spray (1 spray Each Nare Given 03/30/24 0230)     IMPRESSION / MDM / ASSESSMENT AND PLAN / ED COURSE  I reviewed the triage vital signs and the nursing notes.  Differential diagnosis includes, but is not limited to, epistaxis likely in the setting of upper respiratory symptoms versus allergic symptoms, no visible trauma  Patient's presentation is most consistent with acute, uncomplicated illness.  43 year old female presenting with epistaxis, resolved at time of presentation.  She is hypertensive on presentation, reports that she recently ran out of her blood pressure medication.  Given Afrin and provided with nasal clamp here.  Discussed supportive care measures if she does develop recurrent  epistaxis.  Will follow-up with a primary care doctor for further evaluation of her blood pressure.  I have sent a refill of her prior prescription to her preferred pharmacy.  Strict return precautions provided.  Patient discharged in stable condition.      FINAL CLINICAL IMPRESSION(S) / ED DIAGNOSES   Final diagnoses:  Epistaxis  Uncontrolled hypertension     Rx / DC Orders   ED Discharge Orders          Ordered    losartan -hydrochlorothiazide  (HYZAAR) 100-25 MG tablet  Daily        03/30/24 0236    Ambulatory Referral to Primary Care (Establish Care)        03/30/24 0239             Note:  This document was prepared using Dragon voice recognition software and may include unintentional dictation errors.   Levander Slate, MD 03/30/24 [phone]

## 2024-03-30 NOTE — Discharge Instructions (Addendum)
 Follow-up with your PCP for further evaluation including a recheck of your blood.  I sent a prescription for refill of your blood pressure medication to your pharmacy.  Please take this as directed.  Return to the ER for new or worsening symptoms.

## 2024-03-30 NOTE — ED Triage Notes (Signed)
 Patient reports intermittent nose bleed that started this evening. No trauma. Denies blood thinners. Bleeding controlled at this time.

## 2024-04-09 ENCOUNTER — Ambulatory Visit
Admission: RE | Admit: 2024-04-09 | Discharge: 2024-04-09 | Disposition: A | Discharge status: Home / Self Care | Source: Ambulatory Visit | Attending: Family Medicine | Admitting: Family Medicine

## 2024-04-09 VITALS — BP 135/94 | HR 83 | Temp 98.0°F | Resp 18 | Wt 280.0 lb

## 2024-04-09 DIAGNOSIS — G43819 Other migraine, intractable, without status migrainosus: Secondary | ICD-10-CM | POA: Diagnosis not present

## 2024-04-09 MED ORDER — KETOROLAC TROMETHAMINE 30 MG/ML IJ SOLN
30.0000 mg | Freq: Once | INTRAMUSCULAR | Status: AC
  Administered 2024-04-09: 30 mg via INTRAMUSCULAR

## 2024-04-09 MED ORDER — DEXAMETHASONE SODIUM PHOSPHATE 10 MG/ML IJ SOLN
10.0000 mg | Freq: Once | INTRAMUSCULAR | Status: AC
  Administered 2024-04-09: 10 mg via INTRAMUSCULAR

## 2024-04-09 NOTE — ED Provider Notes (Signed)
 [facility]    CSN: [id number] Arrival date & time: 04/09/24  1145      History   Chief Complaint Chief Complaint  Patient presents with   Headache    HPI Makayla Booker is a 43 y.o. female with past medical history of migraines, depression, hypertension, anxiety presents for migraine.  Patient reports 4 to 5 days of a persistent waxing and waning right sided migraine that she currently rates as a 7-8 out of 10 and denies worst headache of her life.  It is associated with photophobia and autophobia, nausea.  Denies any dizziness, syncope, vomiting, neck pain, visual changes unilateral weakness.  Denies auras.  States she is to be on a daily medication for migraines but has been several years.  Currently no rescue medications prescribed.  Denies any first-degree relative with [facility] and denies thunderclap headache.  Has been taking BC powder, Aleve and Tylenol , last dose 2 days ago.  She states she is not taking any OTC medications for her symptoms today.  No other concerns at this time.   Headache Associated symptoms: nausea and photophobia     Past Medical History:  Diagnosis Date   Depression    Per client report.   History of abnormal cervical Pap smear    History of anemia    History of urinary tract infection    Hypertension    Hypertension    Migraines     Patient Active Problem List   Diagnosis Date Noted   Leg mass, right 11/18/2023   Right leg swelling 10/15/2023   Migraines dx'd age 28 11/09/2020   Depression 11/09/2020   Obesity BMI=42.6 02/12/2020   Elevated blood pressure reading 133/96 on 02/12/20/HTN 02/12/2020   Abnormal Pap smear of cervix 02/12/2020   H/O LEEP x2-- 05/06/2010 and 09/13/2016 02/12/2020    History reviewed. No pertinent surgical history.  OB History     Gravida  3   Para  3   Term  1   Preterm  2   AB      Living  1      SAB      IAB      Ectopic      Multiple      Live Births  1            Home  Medications    Prior to Admission medications   Medication Sig Start Date End Date Taking? Authorizing Provider  losartan -hydrochlorothiazide  (HYZAAR) 100-25 MG tablet Take 1 tablet by mouth daily. 03/30/24 06/28/24 Yes Ray, Nilsa, MD  oxyCODONE -acetaminophen  (PERCOCET) 10-325 MG tablet Take 1 tablet by mouth every 6 (six) hours as needed for pain. 12/18/23   Brahmanday, Govinda R, MD  pantoprazole  (PROTONIX ) 20 MG tablet Take 1 tablet (20 mg total) by mouth daily. Patient not taking: Reported on 11/09/2020 08/03/20 12/24/20  Arlander Charleston, MD    Family History Family History  Problem Relation Age of Onset   Cancer Maternal Grandmother    Diabetes Mother    Hypertension Mother    Asthma Mother    Heart disease Paternal Aunt    Diabetes Paternal Aunt    Diabetes Father    Hypertension Sister    Thyroid disease Sister    Lupus Sister    Healthy Son     Social History Social History   Tobacco Use   Smoking status: Never   Smokeless tobacco: Never  Vaping Use   Vaping status: Never Used  Substance Use  Topics   Alcohol use: Yes    Comment: Socially   Drug use: Never     Allergies   Penicillins   Review of Systems Review of Systems  Eyes:  Positive for photophobia.  Gastrointestinal:  Positive for nausea.  Neurological:  Positive for headaches.     Physical Exam Triage Vital Signs ED Triage Vitals  Encounter Vitals Group     BP 04/09/24 1242 (!) 135/94     Girls Systolic BP Percentile --      Girls Diastolic BP Percentile --      Boys Systolic BP Percentile --      Boys Diastolic BP Percentile --      Pulse Rate 04/09/24 1242 83     Resp 04/09/24 1242 18     Temp 04/09/24 1242 98 F (36.7 C)     Temp Source 04/09/24 1242 Oral     SpO2 04/09/24 1242 95 %     Weight 04/09/24 1240 280 lb (127 kg)     Height --      Head Circumference --      Peak Flow --      Pain Score 04/09/24 1240 8     Pain Loc --      Pain Education --      Exclude from Growth Chart  --    No data found.  Updated Vital Signs BP (!) 135/94 (BP Location: Right Arm)   Pulse 83   Temp 98 F (36.7 C) (Oral)   Resp 18   Wt 280 lb (127 kg)   LMP 03/26/2024 (Exact Date)   SpO2 95%   BMI 42.57 kg/m   Visual Acuity Right Eye Distance:   Left Eye Distance:   Bilateral Distance:    Right Eye Near:   Left Eye Near:    Bilateral Near:     Physical Exam Vitals and nursing note reviewed.  Constitutional:      General: She is not in acute distress.    Appearance: Normal appearance. She is not ill-appearing.  HENT:     Head: Normocephalic and atraumatic.  Eyes:     Extraocular Movements: Extraocular movements intact.     Conjunctiva/sclera: Conjunctivae normal.     Pupils: Pupils are equal, round, and reactive to light.  Cardiovascular:     Rate and Rhythm: Normal rate.  Pulmonary:     Effort: Pulmonary effort is normal.  Skin:    General: Skin is warm and dry.  Neurological:     General: No focal deficit present.     Mental Status: She is alert and oriented to person, place, and time.     GCS: GCS eye subscore is 4. GCS verbal subscore is 5. GCS motor subscore is 6.     Cranial Nerves: No facial asymmetry.     Motor: No weakness or pronator drift.     Coordination: Romberg sign negative. Finger-Nose-Finger Test normal.     Gait: Tandem walk normal.     Comments: strength 5 out of 5 bilateral upper extremities  Psychiatric:        Mood and Affect: Mood normal.        Behavior: Behavior normal.      UC Treatments / Results  Labs (all labs ordered are listed, but only abnormal results are displayed) Labs Reviewed - No data to display   Contains abnormal data Basic metabolic panel Order: [id number]  Status: Final result     Next appt: None  Test Result Released: Yes (not seen)   0 Result Notes          Component Ref Range & Units (hover) 6 mo ago (10/12/23) 9 mo ago (06/29/23) 2 yr ago (01/15/22) 2 yr ago (07/23/21) 3 yr ago (08/03/20) 4 yr  ago (05/17/19) 5 yr ago (08/27/18)  Sodium 133 Low  137 138 134 Low  139 136 139 R  Potassium 3.6 3.6 3.4 Low  3.5 3.6 3.8 4.8 R  Chloride 101 103 108 104 105 101 100 R  CO2 23 23 23 23 25 26 23  R  Glucose, Bld 82 128 High  CM 106 High  CM 107 High  CM 111 High  CM 101 High  95 R  Comment: Glucose reference range applies only to samples taken after fasting for at least 8 hours.  BUN 13 18 13 16 24  High  15 12  Creatinine, Ser 0.60 0.69 0.74 0.76 0.95 0.65 0.73 R  Calcium 9.0 9.3 9.4 9.4 8.9 9.1 9.8 R  GFR, Estimated >60 >60 CM >60 CM >60 CM >60 CM    Comment: (NOTE) Calculated using the CKD-EPI Creatinine Equation (2021)  Anion gap 9 11 CM 7 CM 7 CM 9 CM 9 CM   Comment: Performed at [facility], [address] [place], [place] [address]  Resulting Agency [facility]        Specimen Collected: 10/12/23 17:07 Last Resulted: 10/12/23 17:31    EKG   Radiology No results found.  Procedures Procedures (including critical care time)  Medications Ordered in UC Medications  ketorolac  (TORADOL ) 30 MG/ML injection 30 mg (30 mg Intramuscular Given 04/09/24 1325)  dexamethasone  (DECADRON ) injection 10 mg (10 mg Intramuscular Given 04/09/24 1326)    Initial Impression / Assessment and Plan / UC Course  I have reviewed the triage vital signs and the nursing notes.  Pertinent labs & imaging results that were available during my care of the patient were reviewed by me and considered in my medical decision making (see chart for details).     Reviewed exam and symptoms with patient.  Patient presenting with migraine.  Given Toradol  and Decadron  injection in clinic.  Monitored for 10 minutes after injection with no reaction noted and tolerated well.  Discussed no NSAIDs for 24 hours and she verbalized understanding.  Advise keeping a headache diary and taken to PCP for further evaluation and treatment of her  migraines.  Strict ER precautions reviewed and patient verbalized understanding. Final Clinical Impressions(s) / UC Diagnoses   Final diagnoses:  Other migraine without status migrainosus, intractable     Discharge Instructions      You were given a Toradol  injection in clinic today. Do not take any over the counter NSAID's such as Advil , ibuprofen , Aleve, or naproxen for 24 hours. You may take tylenol  if needed.  Keep a headache diary and take to your PCP for further evaluation and treatment options for your migraines.  Please go to the ER if you develop any worsening symptoms.  I hope you feel better soon!      ED Prescriptions   None    PDMP not reviewed this encounter.   Loreda Myla SAUNDERS, NP 04/09/24 1349

## 2024-04-09 NOTE — Discharge Instructions (Signed)
 You were given a Toradol  injection in clinic today. Do not take any over the counter NSAID's such as Advil , ibuprofen , Aleve, or naproxen for 24 hours. You may take tylenol  if needed.  Keep a headache diary and take to your PCP for further evaluation and treatment options for your migraines.  Please go to the ER if you develop any worsening symptoms.  I hope you feel better soon!

## 2024-04-09 NOTE — ED Triage Notes (Signed)
 Patient states that she's had a headache since Sunday. Patient has been taking IBU and tylenol . Patient has been taking her BP meds. BP has been fine. Patient states that she has a hx of migraines

## 2024-06-15 ENCOUNTER — Ambulatory Visit (LOCAL_COMMUNITY_HEALTH_CENTER): Payer: Self-pay

## 2024-06-15 DIAGNOSIS — Z111 Encounter for screening for respiratory tuberculosis: Secondary | ICD-10-CM

## 2024-06-18 ENCOUNTER — Ambulatory Visit (LOCAL_COMMUNITY_HEALTH_CENTER)

## 2024-06-18 DIAGNOSIS — Z111 Encounter for screening for respiratory tuberculosis: Secondary | ICD-10-CM

## 2024-06-18 LAB — TB SKIN TEST
Induration: 0 mm
TB Skin Test: NEGATIVE

## 2024-08-12 ENCOUNTER — Ambulatory Visit: Payer: Self-pay

## 2024-08-17 ENCOUNTER — Ambulatory Visit (LOCAL_COMMUNITY_HEALTH_CENTER): Payer: Self-pay

## 2024-08-17 VITALS — BP 123/82 | HR 73 | Ht 68.0 in | Wt 280.2 lb

## 2024-08-17 DIAGNOSIS — Z124 Encounter for screening for malignant neoplasm of cervix: Secondary | ICD-10-CM

## 2024-08-17 DIAGNOSIS — Z3042 Encounter for surveillance of injectable contraceptive: Secondary | ICD-10-CM

## 2024-08-17 DIAGNOSIS — Z3009 Encounter for other general counseling and advice on contraception: Secondary | ICD-10-CM

## 2024-08-17 DIAGNOSIS — Z30013 Encounter for initial prescription of injectable contraceptive: Secondary | ICD-10-CM

## 2024-08-17 DIAGNOSIS — Z1231 Encounter for screening mammogram for malignant neoplasm of breast: Secondary | ICD-10-CM

## 2024-08-17 MED ORDER — LOSARTAN POTASSIUM-HCTZ 100-25 MG PO TABS: 1.0000 | ORAL_TABLET | Freq: Every day | ORAL | 2 refills | Status: AC

## 2024-08-17 MED ORDER — MEDROXYPROGESTERONE ACETATE 150 MG/ML IM SUSP
150.0000 mg | INTRAMUSCULAR | Status: AC
  Administered 2024-08-17: 150 mg via INTRAMUSCULAR

## 2024-08-17 NOTE — Progress Notes (Cosign Needed Addendum)
 " [facility] [address] [place] [address] Main phone: [phone]  Family Planning Visit - Repeat Yearly Visit  Subjective:  Makayla Booker is a 44 y.o. [id number]  being seen today for an annual wellness visit and to discuss contraception options. The patient is currently using no method - no contraceptive precautions for pregnancy prevention. Patient does not want a pregnancy in the next year.   Patient reports they are looking for a method with the following characteristics:  Does not involve insertion  Discrete method Method that does not involve too much memory  Patient has the following medical problems:  Patient Active Problem List   Diagnosis Date Noted   Leg mass, right 11/18/2023   Right leg swelling 10/15/2023   Migraines dx'd age 89 11/09/2020   Depression 11/09/2020   Obesity BMI=42.6 02/12/2020   Elevated blood pressure reading 133/96 on 02/12/20/HTN 02/12/2020   Abnormal Pap smear of cervix 02/12/2020   H/O LEEP x2-- 05/06/2010 and 09/13/2016 02/12/2020   Chief Complaint  Patient presents with   Annual Exam    HPI Patient reports desire for Depo. Has used Depo in the past, helps with her bleeding. Had work up for heavy menstrual bleeding in March, 2025. Desires hysterectomy eventually - plans to follow up for this. Plans to follow up with Dr. Lovetta at [facility]. No breast concerns, no FH of breast cancer, never had mammogram. Hx of LEEP x2. Per current ASCCP guidelines, she should have a pap every 3 years. Last was 2022 and NILM/negative HPV.  Patient denies other concerns today.   Review of Systems  All other systems reviewed and are negative.   See flowsheet for further details and programmatic requirements Hyperlink available at the top of the signed note in blue.  Flow sheet content below:  Pregnancy Intention Screening Does the patient want to become pregnant in the next  year?: No Does the patient's partner want to become pregnant in the next year?: No Would the patient like to discuss contraceptive options today?: Yes (Desires Depo) Other:  Password: Nat Is it okay to contact you by mail?: Yes Difficulty accessing hygiene products (feminine products/soap) in the last 3 months: No Sexual History What age did you start your period?: 14 How often do you have your period?: monthly (no menses with Depo) Date of last sex?:  (approximately 08/06/24) Has the patient had unprotected sex within the last 5 days?: No Do you have sex with men, women, both men and women?: Men only In the past 2 months how many partners have you had sex with?: 1 In the past 12 months, how many partners have you had sex with?: 1 Is it possible that any of your sex partners in the past 12 months had sex with someone else whild they were still in a sexual relationship with you?: No What ways do you have sex?: Vaginal, Oral Do you or your partner use condoms and/or dental dams every time you have vaginal, oral or anal sex?: No Do you douche?: No Date of last HIV test?:  (11/2020) Have you ever had an STD?: Yes (years ago) Have any of your partners had an STD?: Yes Partner Previous STD?: NGU Date?:  (~ 25 years ago) Have you or your partner ever shot up drugs?: No Have any of your partners used drugs in the past?: No Have you or your partners exchanged money or drugs for sex?: No Counseling Education: Make informed  decision about family planning, Reduce risk of transmission and protection from STD's and HIV, Understand BMI >25 or >18.5 is a health risk (weight management educational materials to be provided to client requests), Promoted daily consumption of MVI with folic acid if capable of conceiving., Review immunization history, inform client of recommended vaccines per CDC's ACIP Guidelines and refer to Immunization clinic, Provide GED counseling if indicated by history, Results of  physical assessment and labs (if performed), How to discontinue the method selected and information on back up method used, How to use the method selected and information on back up method used, How to use the method consistently and correctly, Teach back method completed, Warning signs for rare but serious adverse events and what to do if they experience a warning sign (including emergency 24 hour number, where to seek emergency service outside of hours of operation), When to return for follow up (planned return schedule) (Given / Reviewed: Family Planning Card. Client has PCP.) Contraception Wrap Up Current Method: No Contraceptive Precautions End Method: Hormonal Injection Contraception Counseling Provided: Yes How was the end contraceptive method provided?: Provided on site (Client declined condoms.)  Diabetes screening This patient is 44 y.o. with a BMI of Body mass index is 42.6 kg/m.Makayla Booker  Is patient eligible for diabetes screening (age >35 and BMI >25)?  yes  Was Hgb A1c ordered? no  STI screening Patient reports 1 of partners in last year.  Does this patient desire STI screening?  No - declines  Cervical Cancer Screening   NILM/negative HPV in September 2022 NILM/negative HPV in September 2020  Result Date Procedure Results Follow-ups  04/15/2019 IGP, Aptima HPV DIAGNOSIS:: Comment Specimen adequacy:: Comment Clinician Provided ICD10: Comment Performed by:: Comment PAP Smear Comment: . Note:: Comment Test Methodology: Comment HPV Aptima: Negative     Health Maintenance Due  Topic Date Due   COVID-19 Vaccine (1) Never done   Hepatitis C Screening  Never done   DTaP/Tdap/Td (1 - Tdap) Never done   Hepatitis [place] Vaccines 19-59 Average Risk (1 of 3 - 19+ 3-dose series) Never done   HPV VACCINES (1 - Risk 3-dose SCDM series) Never done   Mammogram  Never done   Influenza Vaccine  Never done   Cervical Cancer Screening (HPV/Pap Cotest)  04/13/2024   The following portions of  the patient's history were reviewed and updated as appropriate: allergies, current medications, past family history, past medical history, past social history, past surgical history and problem list. Problem list updated.  Objective:   Vitals:   08/17/24 1027  BP: 123/82  Pulse: 73  Weight: 280 lb 3.2 oz (127.1 kg)  Height: 5' 8 (1.727 m)   Physical Exam Vitals and nursing note reviewed. Exam conducted with a chaperone present Makayla Booker).  Constitutional:      Appearance: Normal appearance. She is obese.  HENT:     Head: Normocephalic and atraumatic.     Comments: No nits or hair loss of scalp, brows, and lashes    Mouth/Throat:     Mouth: Mucous membranes are moist.     Pharynx: Oropharynx is clear. No oropharyngeal exudate or posterior oropharyngeal erythema.  Eyes:     General:        Right eye: No discharge.        Left eye: No discharge.     Conjunctiva/sclera: Conjunctivae normal.     Right eye: Right conjunctiva is not injected.     Left eye: Left conjunctiva is not injected.  Cardiovascular:     Heart sounds: Normal heart sounds, S1 normal and S2 normal.  Pulmonary:     Effort: Pulmonary effort is normal.     Breath sounds: Normal breath sounds.  Chest:  Breasts:    Right: Normal.     Left: Normal.  Abdominal:     Tenderness: There is no abdominal tenderness. There is no rebound.  Genitourinary:    General: Normal vulva.     Exam position: Lithotomy position.     Pubic Area: No rash or pubic lice.      Labia:        Right: No rash or lesion.        Left: No rash or lesion.      Vagina: Normal. No vaginal discharge, erythema or lesions.     Cervix: Friability and cervical bleeding present. No cervical motion tenderness, discharge, lesion or erythema.  Lymphadenopathy:     Cervical: No cervical adenopathy.     Upper Body:     Right upper body: No supraclavicular or axillary adenopathy.     Left upper body: No supraclavicular or axillary adenopathy.  Skin:     General: Skin is warm and dry.     Findings: No lesion or rash.  Neurological:     Mental Status: She is alert and oriented to person, place, and time.    Assessment and Plan:  Makayla Booker is a 44 y.o. female G3P1201 presenting to the [facility] Department for an yearly wellness and contraception visit  1. Family planning (Primary)  - medroxyPROGESTERone  (DEPO-PROVERA ) injection 150 mg  Contraception counseling:  Reviewed options based on patient desire and reproductive life plan. Patient is interested in Hormonal Injection. This was provided to the patient today.   Risks, benefits, and typical effectiveness rates were reviewed.  Questions were answered.  Written information was also given to the patient to review.    The patient will follow up in  3 months for surveillance.  The patient was told to call with any further questions, or with any concerns about this method of contraception.  Emphasized use of condoms 100% of the time for STI prevention.  Emergency Contraception Precautions (ECP): Patient assessed for need of ECP. She is not a candidate based on no intercourse for >1 week.   2. Depo-Provera  contraceptive status  - medroxyPROGESTERone  (DEPO-PROVERA ) injection 150 mg  3. Screening for cervical cancer  - IGP, Aptima HPV  4. Encounter for screening mammogram for malignant neoplasm of breast  - MM 3D SCREENING MAMMOGRAM BILATERAL BREAST; Future   Return in about 11 weeks (around 11/02/2024). Follow up with Dr. Lovetta regarding desire for hysterectomy. Sent refill of her BP medication to her pharmacy as she reported out, advised her to contact clinic for further refills.  No future appointments.  Damien FORBES Satchel, NP "

## 2024-08-17 NOTE — Progress Notes (Signed)
 Pt here for yearly physical and Depo Provera  injection.  Declines STI screening.  Pap smear done.  Depo Provera  150mg  IM given in RUOQ without difficulty.  Reminder card given for next injection.  Condoms declined.-Anisa Leanos, RN

## 2024-08-19 LAB — IGP, APTIMA HPV
HPV Aptima: POSITIVE — AB
PAP Smear Comment: 0

## 2024-08-21 ENCOUNTER — Ambulatory Visit: Payer: Self-pay

## 2024-08-21 NOTE — Progress Notes (Signed)
 This patient's pap was negative for intraepithelial lesion or malignancy and positive HPV. Remote hx of LEEP for CIN2 in 2011 and 2018. Subsequent pap tests in 2020 and 2022 normal. Per current ASCCP guidelines, follow up pap in 1 year.  Damien Satchel [facility]

## 2024-08-27 NOTE — Progress Notes (Signed)
 Pt is on pap reminder list Larraine JONELLE Novak, RN
